# Patient Record
Sex: Male | Born: 1975
Health system: Southern US, Community
[De-identification: ages and names within clinical notes are randomized; demographics above are authoritative.]

## PROBLEM LIST (undated history)

## (undated) DIAGNOSIS — R569 Unspecified convulsions: Secondary | ICD-10-CM

## (undated) DIAGNOSIS — M792 Neuralgia and neuritis, unspecified: Secondary | ICD-10-CM

## (undated) DIAGNOSIS — G709 Myoneural disorder, unspecified: Secondary | ICD-10-CM

## (undated) DIAGNOSIS — R339 Retention of urine, unspecified: Secondary | ICD-10-CM

## (undated) DIAGNOSIS — S34101A Unspecified injury to L1 level of lumbar spinal cord, initial encounter: Secondary | ICD-10-CM

## (undated) HISTORY — DX: Retention of urine, unspecified: R33.9

## (undated) HISTORY — DX: Myoneural disorder, unspecified: G70.9

## (undated) HISTORY — PX: BACK SURGERY: SHX140

---

## 1999-03-14 ENCOUNTER — Emergency Department (HOSPITAL_COMMUNITY): Admission: EM | Admit: 1999-03-14 | Discharge: 1999-03-14 | Payer: Self-pay | Admitting: Emergency Medicine

## 1999-03-14 ENCOUNTER — Encounter: Payer: Self-pay | Admitting: Emergency Medicine

## 2001-02-06 ENCOUNTER — Emergency Department (HOSPITAL_COMMUNITY): Admission: EM | Admit: 2001-02-06 | Discharge: 2001-02-06 | Payer: Self-pay | Admitting: *Deleted

## 2002-08-29 ENCOUNTER — Emergency Department (HOSPITAL_COMMUNITY): Admission: EM | Admit: 2002-08-29 | Discharge: 2002-08-29 | Payer: Self-pay | Admitting: Emergency Medicine

## 2003-02-08 ENCOUNTER — Emergency Department (HOSPITAL_COMMUNITY): Admission: EM | Admit: 2003-02-08 | Discharge: 2003-02-08 | Payer: Self-pay | Admitting: Emergency Medicine

## 2003-07-23 ENCOUNTER — Emergency Department (HOSPITAL_COMMUNITY): Admission: EM | Admit: 2003-07-23 | Discharge: 2003-07-23 | Payer: Self-pay | Admitting: Emergency Medicine

## 2003-11-18 ENCOUNTER — Emergency Department (HOSPITAL_COMMUNITY): Admission: EM | Admit: 2003-11-18 | Discharge: 2003-11-18 | Payer: Self-pay | Admitting: Emergency Medicine

## 2003-12-01 ENCOUNTER — Emergency Department (HOSPITAL_COMMUNITY): Admission: EM | Admit: 2003-12-01 | Discharge: 2003-12-01 | Payer: Self-pay | Admitting: *Deleted

## 2004-01-20 ENCOUNTER — Emergency Department (HOSPITAL_COMMUNITY): Admission: EM | Admit: 2004-01-20 | Discharge: 2004-01-20 | Payer: Self-pay | Admitting: Emergency Medicine

## 2004-11-04 ENCOUNTER — Emergency Department (HOSPITAL_COMMUNITY): Admission: EM | Admit: 2004-11-04 | Discharge: 2004-11-04 | Payer: Self-pay | Admitting: Emergency Medicine

## 2005-08-13 ENCOUNTER — Emergency Department (HOSPITAL_COMMUNITY): Admission: EM | Admit: 2005-08-13 | Discharge: 2005-08-13 | Payer: Self-pay | Admitting: *Deleted

## 2006-03-14 ENCOUNTER — Inpatient Hospital Stay (HOSPITAL_COMMUNITY): Admission: EM | Admit: 2006-03-14 | Discharge: 2006-03-16 | Payer: Self-pay | Admitting: Emergency Medicine

## 2006-03-17 ENCOUNTER — Emergency Department (HOSPITAL_COMMUNITY): Admission: EM | Admit: 2006-03-17 | Discharge: 2006-03-17 | Payer: Self-pay | Admitting: Emergency Medicine

## 2006-03-20 ENCOUNTER — Ambulatory Visit: Payer: Self-pay | Admitting: Gastroenterology

## 2008-06-05 ENCOUNTER — Encounter: Admission: RE | Admit: 2008-06-05 | Discharge: 2008-06-05 | Payer: Self-pay | Admitting: Internal Medicine

## 2008-09-23 ENCOUNTER — Emergency Department (HOSPITAL_COMMUNITY): Admission: EM | Admit: 2008-09-23 | Discharge: 2008-09-23 | Payer: Self-pay | Admitting: Emergency Medicine

## 2010-08-09 ENCOUNTER — Emergency Department (HOSPITAL_COMMUNITY)
Admission: EM | Admit: 2010-08-09 | Discharge: 2010-08-09 | Disposition: A | Discharge status: Home / Self Care | Payer: Medicare Other | Attending: Emergency Medicine | Admitting: Emergency Medicine

## 2010-08-09 DIAGNOSIS — G40909 Epilepsy, unspecified, not intractable, without status epilepticus: Secondary | ICD-10-CM | POA: Insufficient documentation

## 2010-08-09 DIAGNOSIS — F29 Unspecified psychosis not due to a substance or known physiological condition: Secondary | ICD-10-CM | POA: Insufficient documentation
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
## 2012-05-20 ENCOUNTER — Ambulatory Visit
Admission: RE | Admit: 2012-05-20 | Discharge: 2012-05-20 | Disposition: A | Discharge status: Home / Self Care | Payer: Medicare Other | Source: Ambulatory Visit | Attending: Family Medicine | Admitting: Family Medicine

## 2012-05-20 ENCOUNTER — Other Ambulatory Visit: Payer: Self-pay | Admitting: Family Medicine

## 2012-05-20 DIAGNOSIS — T148XXA Other injury of unspecified body region, initial encounter: Secondary | ICD-10-CM

## 2012-06-06 ENCOUNTER — Encounter (HOSPITAL_COMMUNITY): Payer: Self-pay | Admitting: Emergency Medicine

## 2012-06-06 ENCOUNTER — Inpatient Hospital Stay (HOSPITAL_COMMUNITY)
Admission: EM | Admit: 2012-06-06 | Discharge: 2012-06-07 | DRG: 101 | Disposition: A | Discharge status: Home / Self Care | Payer: Medicare Other | Attending: Family Medicine | Admitting: Family Medicine

## 2012-06-06 DIAGNOSIS — G40909 Epilepsy, unspecified, not intractable, without status epilepticus: Secondary | ICD-10-CM

## 2012-06-06 DIAGNOSIS — R7309 Other abnormal glucose: Secondary | ICD-10-CM | POA: Diagnosis present

## 2012-06-06 DIAGNOSIS — Z79899 Other long term (current) drug therapy: Secondary | ICD-10-CM

## 2012-06-06 DIAGNOSIS — E872 Acidosis, unspecified: Secondary | ICD-10-CM | POA: Diagnosis present

## 2012-06-06 DIAGNOSIS — R569 Unspecified convulsions: Secondary | ICD-10-CM | POA: Diagnosis present

## 2012-06-06 DIAGNOSIS — D72829 Elevated white blood cell count, unspecified: Secondary | ICD-10-CM | POA: Diagnosis present

## 2012-06-06 DIAGNOSIS — F17200 Nicotine dependence, unspecified, uncomplicated: Secondary | ICD-10-CM | POA: Diagnosis present

## 2012-06-06 DIAGNOSIS — Z720 Tobacco use: Secondary | ICD-10-CM

## 2012-06-06 DIAGNOSIS — G40802 Other epilepsy, not intractable, without status epilepticus: Principal | ICD-10-CM | POA: Diagnosis present

## 2012-06-06 DIAGNOSIS — R739 Hyperglycemia, unspecified: Secondary | ICD-10-CM | POA: Diagnosis present

## 2012-06-06 DIAGNOSIS — N390 Urinary tract infection, site not specified: Secondary | ICD-10-CM | POA: Diagnosis present

## 2012-06-06 HISTORY — DX: Unspecified convulsions: R56.9

## 2012-06-06 HISTORY — DX: Unspecified injury to L1 level of lumbar spinal cord, initial encounter: S34.101A

## 2012-06-06 LAB — COMPREHENSIVE METABOLIC PANEL
ALT: 17 U/L (ref 0–53)
AST: 22 U/L (ref 0–37)
Albumin: 3.8 g/dL (ref 3.5–5.2)
Alkaline Phosphatase: 100 U/L (ref 39–117)
BUN: 13 mg/dL (ref 6–23)
CO2: 10 mEq/L — CL (ref 19–32)
GFR calc non Af Amer: >90 mL/min (ref >90)
Glucose, Bld: 289 mg/dL — ABNORMAL HIGH (ref 70–99)
Total Bilirubin: 0.2 mg/dL — ABNORMAL LOW (ref 0.3–1.2)

## 2012-06-06 LAB — CBC WITH DIFFERENTIAL/PLATELET
Basophils Absolute: 0 10*3/uL (ref 0.0–0.1)
Eosinophils Absolute: 0 10*3/uL (ref 0.0–0.7)
Eosinophils Relative: 0 % (ref 0–5)
HCT: 40.8 % (ref 39.0–52.0)
Lymphs Abs: 2 10*3/uL (ref 0.7–4.0)
Monocytes Absolute: 0.8 10*3/uL (ref 0.1–1.0)
WBC: 25.5 10*3/uL — ABNORMAL HIGH (ref 4.0–10.5)

## 2012-06-06 LAB — URINALYSIS, ROUTINE W REFLEX MICROSCOPIC
Ketones, ur: 40 mg/dL — AB
Nitrite: NEGATIVE
Protein, ur: 30 mg/dL — AB
Specific Gravity, Urine: 1.02 (ref 1.005–1.030)
pH: 5 (ref 5.0–8.0)

## 2012-06-06 LAB — POCT I-STAT, CHEM 8
BUN: 13 mg/dL (ref 6–23)
Creatinine, Ser: 1 mg/dL (ref 0.50–1.35)
Glucose, Bld: 284 mg/dL — ABNORMAL HIGH (ref 70–99)
HCT: 44 % (ref 39.0–52.0)
Hemoglobin: 15 g/dL (ref 13.0–17.0)
Potassium: 3.7 mEq/L (ref 3.5–5.1)
Sodium: 137 mEq/L (ref 135–145)
TCO2: 12 mmol/L (ref 0–100)

## 2012-06-06 LAB — URINE MICROSCOPIC-ADD ON

## 2012-06-06 LAB — GLUCOSE, CAPILLARY

## 2012-06-06 LAB — RAPID URINE DRUG SCREEN, HOSP PERFORMED: Amphetamines: NOT DETECTED

## 2012-06-06 MED ORDER — ONDANSETRON HCL 4 MG/2ML IJ SOLN
4.0000 mg | Freq: Once | INTRAMUSCULAR | Status: AC
  Administered 2012-06-06: 4 mg via INTRAVENOUS
  Filled 2012-06-06: qty 2

## 2012-06-06 NOTE — ED Notes (Signed)
Pt brought to ED via [facility] for evaluation of seizures X2, hx of seizures in the past.  Pt had a seizure, sister called EMS- EMS found pt to be postictal pale and diaphoretic, pt then had another seizure witnessed by EMS lasting around 40 seconds.  Pt postictal upon arrival to ED, IV in place.  Pt able to speak in full sentences, placed on cardiac monitor, IV in place.

## 2012-06-06 NOTE — ED Notes (Signed)
Pt's sister, Gilliam Hawkes, would like to be contacted if there is an emergency or when the pt is going to be discharged. [phone]

## 2012-06-06 NOTE — ED Notes (Signed)
Clear yellow urine returned with I&O cath, no complications.

## 2012-06-06 NOTE — ED Provider Notes (Signed)
History     CSN: [id number]  Arrival date & time 06/06/12  1904   First MD Initiated Contact with Patient 06/06/12 1929      Chief Complaint  Patient presents with  . Seizures   Level V caveat altered mental status (Consider location/radiation/quality/duration/timing/severity/associated sxs/prior treatment) HPI The patient had a seizure today. Denies noncompliance with his Neurontin. Presently complains of nausea no other complaint. No treatment prior to coming here Past Medical History  Diagnosis Date  . Seizures     History reviewed. No pertinent past surgical history.  No family history on file.  History  Substance Use Topics  . Smoking status: Not on file  . Smokeless tobacco: Not on file  . Alcohol Use: Not on file   admits to alcohol no drug use no tobacco    Review of Systems  Unable to perform ROS: Mental status change  Gastrointestinal: Positive for nausea.    Allergies  Review of patient's allergies indicates no known allergies.  Home Medications   Current Outpatient Rx  Name  Route  Sig  Dispense  Refill  . gabapentin (NEURONTIN) 800 MG tablet   Oral   Take 800 mg by mouth 3 (three) times daily.           BP 116/69  Temp(Src) 96.6 F (35.9 C) (Rectal)  Resp 14  SpO2 96%  Physical Exam  Nursing note and vitals reviewed. Constitutional: He appears well-developed and well-nourished.  HENT:  Head: Normocephalic and atraumatic.  Abrasion to her left lateral tongue  Eyes: Conjunctivae are normal. Pupils are equal, round, and reactive to light.  Neck: Neck supple. No tracheal deviation present. No thyromegaly present.  Cardiovascular: Normal rate and regular rhythm.   No murmur heard. Pulmonary/Chest: Effort normal and breath sounds normal.  Abdominal: Soft. Bowel sounds are normal. He exhibits no distension. There is no tenderness.  Musculoskeletal: Normal range of motion. He exhibits no edema and no tenderness.  Neurological: He is  alert. He has normal reflexes. No cranial nerve deficit. Coordination normal.  Oriented to name and hospital is on the date  Skin: Skin is warm and dry. No rash noted.  Psychiatric: He has a normal mood and affect.    ED Course  Procedures (including critical care time)  Labs Reviewed  CBC WITH DIFFERENTIAL  COMPREHENSIVE METABOLIC PANEL   Date: 06/06/2012  Rate: 105  Rhythm: sinus tachycardia  QRS Axis: right  Intervals: normal  ST/T Wave abnormalities: normal  Conduction Disutrbances:none  Narrative Interpretation:   Old EKG Reviewed: none available  No results found.  Results for orders placed during the hospital encounter of 06/06/12  CBC WITH DIFFERENTIAL      Result Value Range   WBC 25.5 (*) 4.0 - 10.5 K/uL   RBC 4.71  4.22 - 5.81 MIL/uL   Hemoglobin 14.3  13.0 - 17.0 g/dL   HCT 40.9  81.1 - 91.4 %   MCV 86.6  78.0 - 100.0 fL   MCH 30.4  26.0 - 34.0 pg   MCHC 35.0  30.0 - 36.0 g/dL   RDW 78.2  95.6 - 21.3 %   Platelets 329  150 - 400 K/uL   Neutrophils Relative 89 (*) 43 - 77 %   Lymphocytes Relative 8 (*) 12 - 46 %   Monocytes Relative 3  3 - 12 %   Eosinophils Relative 0  0 - 5 %   Basophils Relative 0  0 - 1 %   Neutro Abs  22.7 (*) 1.7 - 7.7 K/uL   Lymphs Abs 2.0  0.7 - 4.0 K/uL   Monocytes Absolute 0.8  0.1 - 1.0 K/uL   Eosinophils Absolute 0.0  0.0 - 0.7 K/uL   Basophils Absolute 0.0  0.0 - 0.1 K/uL   WBC Morphology ATYPICAL LYMPHOCYTES     Smear Review LARGE PLATELETS PRESENT    COMPREHENSIVE METABOLIC PANEL      Result Value Range   Sodium 134 (*) 135 - 145 mEq/L   Potassium 3.7  3.5 - 5.1 mEq/L   Chloride 99  96 - 112 mEq/L   CO2 10 (*) 19 - 32 mEq/L   Glucose, Bld 289 (*) 70 - 99 mg/dL   BUN 13  6 - 23 mg/dL   Creatinine, Ser 1.61  0.50 - 1.35 mg/dL   Calcium 8.7  8.4 - 09.6 mg/dL   Total Protein 7.5  6.0 - 8.3 g/dL   Albumin 3.8  3.5 - 5.2 g/dL   AST 22  0 - 37 U/L   ALT 17  0 - 53 U/L   Alkaline Phosphatase 100  39 - 117 U/L   Total  Bilirubin 0.2 (*) 0.3 - 1.2 mg/dL   GFR calc non Af Amer >90  >90 mL/min   GFR calc Af Amer >90  >90 mL/min  GLUCOSE, CAPILLARY      Result Value Range   Glucose-Capillary 249 (*) 70 - 99 mg/dL   Comment 1 Documented in Chart     Comment 2 Notify RN    POCT I-STAT, CHEM 8      Result Value Range   Sodium 137  135 - 145 mEq/L   Potassium 3.7  3.5 - 5.1 mEq/L   Chloride 109  96 - 112 mEq/L   BUN 13  6 - 23 mg/dL   Creatinine, Ser 0.45  0.50 - 1.35 mg/dL   Glucose, Bld 409 (*) 70 - 99 mg/dL   Calcium, Ion 8.11  9.14 - 1.23 mmol/L   TCO2 12  0 - 100 mmol/L   Hemoglobin 15.0  13.0 - 17.0 g/dL   HCT 78.2  95.6 - 21.3 %   Dg Foot Complete Left  05/20/2012  *RADIOLOGY REPORT*  Clinical Data: Larey Seat 2 months ago with pain and swelling of the left second toe  LEFT FOOT - COMPLETE 3+ VIEW  Comparison: None.  Findings: There is callus and new bone formation around a healing or healed distal left second metatarsal fracture near the neck.  No significant displacement is seen.  There is mild degenerative change at the left first MTP joint and left first DIP joint.  No erosion is seen.  Tarsal - metatarsal alignment is normal.  IMPRESSION:  1.  Healed or healing distal left second metatarsal fracture without significant displacement. 2.  Degenerative change involves the left great toe.   Original Report Authenticated By: Dwyane Dee, M.D.     No diagnosis found. At 11:30 PM patient remains disoriented to month and year. He does know the president. he knows his address he states he feels improved he is alert moves all extremities Patient aunt arrived who reports the patient had 2 generalized seizures today. She feels it has been compliant with his medications  MDM  Patient experienced prolonged post ictal phase after having had 2 seizures today. Leukocytosis and metabolic acidosis likely secondary to having had seizures Spoke with Dr. Toniann Fail Plan admit telemetry Dx #1seizure  disorder #2hyperglycemia  Doug Sou, MD 06/06/12 [phone]

## 2012-06-06 NOTE — ED Notes (Signed)
Critical CO2: 10 reported to Dr. Ethelda Chick.

## 2012-06-07 ENCOUNTER — Ambulatory Visit (HOSPITAL_COMMUNITY): Payer: Medicare Other

## 2012-06-07 ENCOUNTER — Encounter (HOSPITAL_COMMUNITY): Payer: Self-pay | Admitting: Internal Medicine

## 2012-06-07 ENCOUNTER — Observation Stay (HOSPITAL_COMMUNITY): Payer: Medicare Other

## 2012-06-07 DIAGNOSIS — G40909 Epilepsy, unspecified, not intractable, without status epilepticus: Secondary | ICD-10-CM

## 2012-06-07 DIAGNOSIS — R739 Hyperglycemia, unspecified: Secondary | ICD-10-CM | POA: Diagnosis present

## 2012-06-07 DIAGNOSIS — Z720 Tobacco use: Secondary | ICD-10-CM | POA: Diagnosis present

## 2012-06-07 DIAGNOSIS — E872 Acidosis, unspecified: Secondary | ICD-10-CM | POA: Diagnosis present

## 2012-06-07 DIAGNOSIS — D72829 Elevated white blood cell count, unspecified: Secondary | ICD-10-CM | POA: Diagnosis present

## 2012-06-07 DIAGNOSIS — R569 Unspecified convulsions: Secondary | ICD-10-CM | POA: Diagnosis present

## 2012-06-07 LAB — COMPREHENSIVE METABOLIC PANEL
AST: 27 U/L (ref 0–37)
BUN: 17 mg/dL (ref 6–23)
CO2: 20 mEq/L (ref 19–32)
Chloride: 104 mEq/L (ref 96–112)
Creatinine, Ser: 1.91 mg/dL — ABNORMAL HIGH (ref 0.50–1.35)
GFR calc non Af Amer: 43 mL/min — ABNORMAL LOW (ref >90)
Glucose, Bld: 104 mg/dL — ABNORMAL HIGH (ref 70–99)
Total Bilirubin: 0.2 mg/dL — ABNORMAL LOW (ref 0.3–1.2)

## 2012-06-07 LAB — BASIC METABOLIC PANEL
BUN: 16 mg/dL (ref 6–23)
Calcium: 8.9 mg/dL (ref 8.4–10.5)
Creatinine, Ser: 1.3 mg/dL (ref 0.50–1.35)
GFR calc Af Amer: 79 mL/min — ABNORMAL LOW (ref >90)
GFR calc non Af Amer: 68 mL/min — ABNORMAL LOW (ref >90)
Glucose, Bld: 98 mg/dL (ref 70–99)
Sodium: 136 mEq/L (ref 135–145)

## 2012-06-07 LAB — CBC WITH DIFFERENTIAL/PLATELET
Eosinophils Absolute: 0 10*3/uL (ref 0.0–0.7)
Lymphs Abs: 2.2 10*3/uL (ref 0.7–4.0)
MCH: 29.9 pg (ref 26.0–34.0)
Neutrophils Relative %: 82 % — ABNORMAL HIGH (ref 43–77)
Platelets: 276 10*3/uL (ref 150–400)
RBC: 4.15 MIL/uL — ABNORMAL LOW (ref 4.22–5.81)
WBC: 19.9 10*3/uL — ABNORMAL HIGH (ref 4.0–10.5)

## 2012-06-07 LAB — LACTIC ACID, PLASMA: Lactic Acid, Venous: 1.6 mmol/L (ref 0.5–2.2)

## 2012-06-07 LAB — GLUCOSE, CAPILLARY: Glucose-Capillary: 108 mg/dL — ABNORMAL HIGH (ref 70–99)

## 2012-06-07 MED ORDER — ACETAMINOPHEN 325 MG PO TABS
650.0000 mg | ORAL_TABLET | Freq: Four times a day (QID) | ORAL | Status: DC | PRN
  Administered 2012-06-07 (×2): 650 mg via ORAL
  Filled 2012-06-07 (×2): qty 2

## 2012-06-07 MED ORDER — ONDANSETRON HCL 4 MG PO TABS: 4.0000 mg | ORAL_TABLET | Freq: Four times a day (QID) | ORAL | Status: DC | PRN

## 2012-06-07 MED ORDER — SULFAMETHOXAZOLE-TRIMETHOPRIM 800-160 MG PO TABS: 1.0000 | ORAL_TABLET | Freq: Two times a day (BID) | ORAL | Status: DC

## 2012-06-07 MED ORDER — LEVETIRACETAM 500 MG PO TABS
500.0000 mg | ORAL_TABLET | Freq: Two times a day (BID) | ORAL | Status: DC
  Administered 2012-06-07: 500 mg via ORAL
  Filled 2012-06-07 (×2): qty 1

## 2012-06-07 MED ORDER — NICOTINE 14 MG/24HR TD PT24
14.0000 mg | MEDICATED_PATCH | Freq: Every day | TRANSDERMAL | Status: DC
  Administered 2012-06-07: 14 mg via TRANSDERMAL
  Filled 2012-06-07: qty 1

## 2012-06-07 MED ORDER — SODIUM CHLORIDE 0.9 % IV SOLN
1000.0000 mg | Freq: Once | INTRAVENOUS | Status: DC
  Filled 2012-06-07: qty 10

## 2012-06-07 MED ORDER — LEVETIRACETAM 500 MG PO TABS: 500.0000 mg | ORAL_TABLET | Freq: Two times a day (BID) | ORAL | Status: DC

## 2012-06-07 MED ORDER — LORAZEPAM 2 MG/ML IJ SOLN: 1.0000 mg | INTRAMUSCULAR | Status: DC | PRN

## 2012-06-07 MED ORDER — SODIUM CHLORIDE 0.9 % IV SOLN
INTRAVENOUS | Status: DC
  Administered 2012-06-07: 02:00:00 via INTRAVENOUS

## 2012-06-07 MED ORDER — ONDANSETRON HCL 4 MG/2ML IJ SOLN: 4.0000 mg | Freq: Four times a day (QID) | INTRAMUSCULAR | Status: DC | PRN

## 2012-06-07 MED ORDER — ACETAMINOPHEN 650 MG RE SUPP: 650.0000 mg | Freq: Four times a day (QID) | RECTAL | Status: DC | PRN

## 2012-06-07 MED ORDER — DEXTROSE 5 % IV SOLN
1.0000 g | INTRAVENOUS | Status: DC
  Filled 2012-06-07: qty 10

## 2012-06-07 MED ORDER — GABAPENTIN 400 MG PO CAPS
800.0000 mg | ORAL_CAPSULE | Freq: Three times a day (TID) | ORAL | Status: DC
Start: 2012-06-07 — End: 2012-06-07
  Administered 2012-06-07: 800 mg via ORAL
  Filled 2012-06-07 (×3): qty 2

## 2012-06-07 MED ORDER — SODIUM CHLORIDE 0.9 % IJ SOLN
3.0000 mL | Freq: Two times a day (BID) | INTRAMUSCULAR | Status: DC
  Administered 2012-06-07: 3 mL via INTRAVENOUS

## 2012-06-07 MED ORDER — INSULIN ASPART 100 UNIT/ML ~~LOC~~ SOLN: 0.0000 [IU] | Freq: Three times a day (TID) | SUBCUTANEOUS | Status: DC
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
## 2012-06-07 NOTE — Discharge Summary (Signed)
Physician Discharge Summary  Thomas Morales:[id number] DOB: 1976/03/31 DOA: 06/06/2012  PCP: Default, Provider, MD  Admit date: 06/06/2012 Discharge date: 06/07/2012  Time spent: 20 minutes  Recommendations for Outpatient Follow-up:  1. Please follow urine cultures ordered which are pending from admission 2. Continue Keppra 500 po bid   Discharge Diagnoses:  Principal Problem:   Seizures Active Problems:   Tobacco abuse   Hyperglycemia   Metabolic acidosis   Leucocytosis   Discharge Condition: Good   Diet recommendation: regular  Filed Weights   06/07/12 0145  Weight: 60 kg (132 lb 4.4 oz)    History of present illness:  37 year old male admitted to 06/07/12 morning. Patient had 2 episodes of seizures and was slightly confused. As noted he had a metabolic acidosis and hyperglycemia with elevated white count. Patient was loaded with IV Keppra per neurology instructions and then recommended to continue Keppra 500 twice a day in addition to his home dosage of Neurontin.  Patient also the urine culture done given leukocytosis-thought to be secondary to seizure activity-urine however did show some bacteria therefore patient was discharged home on Bactrim twice a day-she has primary care physician who can followup with him regarding this. Patient exhibits little burning in the urine dysuria fever or chills  Procedures:  CT head 2/21 = no acute intracranial abnormality  EEG ordered but pending at time of discharge  Consultations:  Neurology  Discharge Exam: Filed Vitals:   06/07/12 0030 06/07/12 0145 06/07/12 0200 06/07/12 0600  BP: 113/71  121/87 105/51  Pulse: 64  70 73  Temp:   98.3 F (36.8 C) 98 F (36.7 C)  TempSrc:      Resp:   16 16  Height:  5\' 8"  (1.727 m)    Weight:  60 kg (132 lb 4.4 oz)    SpO2: 95%  100% 98%   Alert well no acute distress no further seizure activity Chest clinically clear no added sound S1-S2 no murmur rub or Neurologically good  power bilaterally upper and lower extremities reflexes baseline sensory loss in L4-5 S1 distribution with some sparing on the left  Discharge Instructions     Medication List    ASK your doctor about these medications       gabapentin 800 MG tablet  Commonly known as:  NEURONTIN  Take 800 mg by mouth 3 (three) times daily.          The results of significant diagnostics from this hospitalization (including imaging, microbiology, ancillary and laboratory) are listed below for reference.    Significant Diagnostic Studies: Ct Head Wo Contrast  06/07/2012  *RADIOLOGY REPORT*  Clinical Data: Seizure  CT HEAD WITHOUT CONTRAST  Technique:  Contiguous axial images were obtained from the base of the skull through the vertex without contrast.  Comparison: 11/04/2004  Findings: There is no evidence for acute hemorrhage, hydrocephalus, mass lesion, or abnormal extra-axial fluid collection.  No definite CT evidence for acute infarction.  Right maxillary sinus mucosal thickening.  Otherwise, the visualized paranasal sinuses and mastoid air cells are predominately clear.  IMPRESSION: No acute intracranial abnormality.   Original Report Authenticated By: Jearld Lesch, M.D.    Dg Foot Complete Left  05/20/2012  *RADIOLOGY REPORT*  Clinical Data: Larey Seat 2 months ago with pain and swelling of the left second toe  LEFT FOOT - COMPLETE 3+ VIEW  Comparison: None.  Findings: There is callus and new bone formation around a healing or healed distal left second metatarsal fracture  near the neck.  No significant displacement is seen.  There is mild degenerative change at the left first MTP joint and left first DIP joint.  No erosion is seen.  Tarsal - metatarsal alignment is normal.  IMPRESSION:  1.  Healed or healing distal left second metatarsal fracture without significant displacement. 2.  Degenerative change involves the left great toe.   Original Report Authenticated By: Dwyane Dee, M.D.      Microbiology: No results found for this or any previous visit (from the past 240 hour(s)).   Labs: Basic Metabolic Panel:  Recent Labs Lab 06/06/12 1941 06/06/12 2010 06/06/12 2336 06/07/12 0604  NA 134* 137 136 135  K 3.7 3.7 4.1 4.1  CL 99 109 103 104  CO2 10*  --  22 20  GLUCOSE 289* 284* 98 104*  BUN 13 13 16 17   CREATININE 0.90 1.00 1.30 1.91*  CALCIUM 8.7  --  8.9 8.4   Liver Function Tests:  Recent Labs Lab 06/06/12 1941 06/07/12 0604  AST 22 27  ALT 17 14  ALKPHOS 100 82  BILITOT 0.2* 0.2*  PROT 7.5 6.5  ALBUMIN 3.8 3.2*   No results found for this basename: LIPASE, AMYLASE,  in the last 168 hours No results found for this basename: AMMONIA,  in the last 168 hours CBC:  Recent Labs Lab 06/06/12 1941 06/06/12 2010 06/07/12 0604  WBC 25.5*  --  19.9*  NEUTROABS 22.7*  --  16.3*  HGB 14.3 15.0 12.4*  HCT 40.8 44.0 35.1*  MCV 86.6  --  84.6  PLT 329  --  276   Cardiac Enzymes: No results found for this basename: CKTOTAL, CKMB, CKMBINDEX, TROPONINI,  in the last 168 hours BNP: BNP (last 3 results) No results found for this basename: PROBNP,  in the last 8760 hours CBG:  Recent Labs Lab 06/06/12 1958 06/07/12 0211 06/07/12 0643 06/07/12 1113  GLUCAP 249* 108* 120* 124*       Signed:  Rhetta Mura  [facility] 06/07/2012, 1:26 PM

## 2012-06-07 NOTE — ED Notes (Signed)
Pt given PO water, tolerating well

## 2012-06-08 LAB — URINE CULTURE

## 2012-07-25 ENCOUNTER — Other Ambulatory Visit: Payer: Self-pay | Admitting: Family Medicine

## 2012-07-25 NOTE — Telephone Encounter (Signed)
rx rf per protocol °

## 2012-09-16 ENCOUNTER — Other Ambulatory Visit: Payer: Self-pay | Admitting: Family Medicine

## 2012-10-14 ENCOUNTER — Telehealth: Payer: Self-pay | Admitting: Family Medicine

## 2012-10-17 NOTE — Telephone Encounter (Signed)
Need ov and appt made.

## 2012-10-25 ENCOUNTER — Ambulatory Visit (INDEPENDENT_AMBULATORY_CARE_PROVIDER_SITE_OTHER): Payer: Medicare Other | Admitting: Family Medicine

## 2012-10-25 ENCOUNTER — Encounter: Payer: Self-pay | Admitting: Family Medicine

## 2012-10-25 VITALS — BP 80/54 | HR 110 | Temp 98.3°F | Resp 16 | Ht 67.5 in | Wt 125.0 lb

## 2012-10-25 DIAGNOSIS — R339 Retention of urine, unspecified: Secondary | ICD-10-CM | POA: Insufficient documentation

## 2012-10-25 DIAGNOSIS — I959 Hypotension, unspecified: Secondary | ICD-10-CM

## 2012-10-25 DIAGNOSIS — G709 Myoneural disorder, unspecified: Secondary | ICD-10-CM | POA: Insufficient documentation

## 2012-10-25 DIAGNOSIS — N200 Calculus of kidney: Secondary | ICD-10-CM

## 2012-10-25 DIAGNOSIS — M25569 Pain in unspecified knee: Secondary | ICD-10-CM

## 2012-10-25 DIAGNOSIS — M25562 Pain in left knee: Secondary | ICD-10-CM

## 2012-10-25 LAB — COMPLETE METABOLIC PANEL WITH GFR
ALT: 14 U/L (ref 0–53)
Albumin: 3.7 g/dL (ref 3.5–5.2)
CO2: 24 mEq/L (ref 19–32)
Calcium: 8.5 mg/dL (ref 8.4–10.5)
Chloride: 109 mEq/L (ref 96–112)
GFR, Est African American: >89 mL/min
GFR, Est Non African American: >89 mL/min
Glucose, Bld: 83 mg/dL (ref 70–99)
Potassium: 4 mEq/L (ref 3.5–5.3)
Sodium: 141 mEq/L (ref 135–145)
Total Protein: 5.8 g/dL — ABNORMAL LOW (ref 6.0–8.3)

## 2012-10-25 LAB — CBC W/MCH & 3 PART DIFF
Lymphocytes Relative: 37 % (ref 12–46)
MCH: 30 pg (ref 26.0–34.0)
MCHC: 33.3 g/dL (ref 30.0–36.0)
MCV: 89.9 fL (ref 78.0–100.0)
Platelets: 254 10*3/uL (ref 150–400)
RDW: 13.7 % (ref 11.5–15.5)
WBC mixed population: 0.8 10*3/uL (ref 0.1–1.8)
WBC: 7.5 10*3/uL (ref 4.0–10.5)

## 2012-10-25 MED ORDER — SODIUM CHLORIDE 0.9 % IV SOLN
Freq: Once | INTRAVENOUS | Status: AC
  Administered 2012-10-25: 1500 mL via INTRAVENOUS
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
## 2012-10-29 ENCOUNTER — Telehealth: Payer: Self-pay | Admitting: Family Medicine

## 2012-10-31 MED ORDER — HYDROCODONE-ACETAMINOPHEN 5-325 MG PO TABS: 1.0000 | ORAL_TABLET | Freq: Three times a day (TID) | ORAL | Status: DC | PRN

## 2012-10-31 NOTE — Telephone Encounter (Signed)
Per Dr. Tanya Nones we are still waiting on drug screen but in the mean time he can have #30 Norco 5/325 mg..Patient aware and med c/o

## 2012-11-04 ENCOUNTER — Other Ambulatory Visit: Payer: Self-pay | Admitting: Family Medicine

## 2012-11-04 DIAGNOSIS — M5416 Radiculopathy, lumbar region: Secondary | ICD-10-CM

## 2012-11-09 ENCOUNTER — Other Ambulatory Visit: Payer: Self-pay | Admitting: Family Medicine

## 2012-12-29 ENCOUNTER — Other Ambulatory Visit: Payer: Self-pay | Admitting: Family Medicine

## 2013-02-03 ENCOUNTER — Telehealth: Payer: Self-pay | Admitting: Family Medicine

## 2013-02-03 NOTE — Telephone Encounter (Signed)
Finally got a appointment to the pain clinic . Wants to know if you can give him something until his appointment? Please call

## 2013-02-03 NOTE — Telephone Encounter (Signed)
Pt aware of message

## 2013-02-03 NOTE — Telephone Encounter (Signed)
Needs to be seen at pain clinic.

## 2013-02-03 NOTE — Telephone Encounter (Signed)
Got appt to pain clinic, wants something until he is seen.

## 2013-06-01 ENCOUNTER — Other Ambulatory Visit: Payer: Self-pay | Admitting: Family Medicine

## 2013-08-04 ENCOUNTER — Other Ambulatory Visit (HOSPITAL_COMMUNITY): Payer: Self-pay | Admitting: Family Medicine

## 2013-08-09 IMAGING — CT CT HEAD W/O CM
1 series · 16 of 30 positions shown, 20 images · non-contrast
Comparison: 11/04/2004

CLINICAL DATA: Seizure

CT HEAD WITHOUT CONTRAST
TECHNIQUE: Contiguous axial images were obtained from the base of
the skull through the vertex without contrast.

[Series 2: head trauma 4.8 h37s · axial · 0.45mm/px · z∈[-101,+56]mm · 16 of 36 slices shown, 20 images]
[im 2/36  brain]
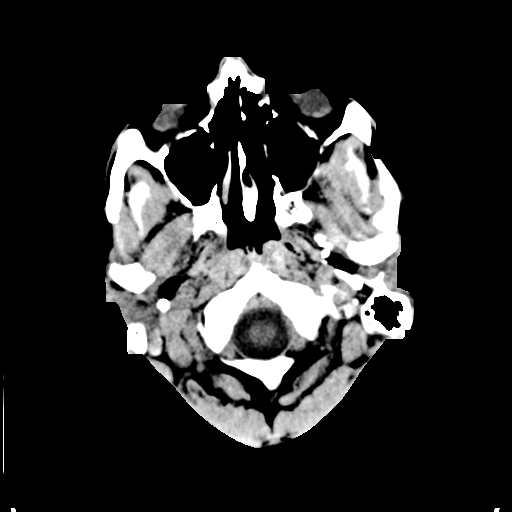
[im 2/36  bone]
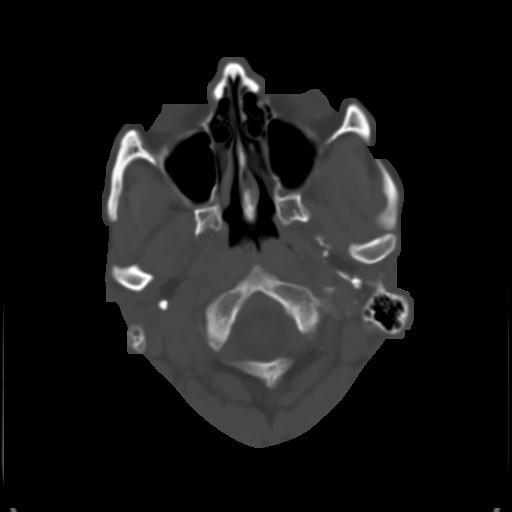
[im 4/36  brain]
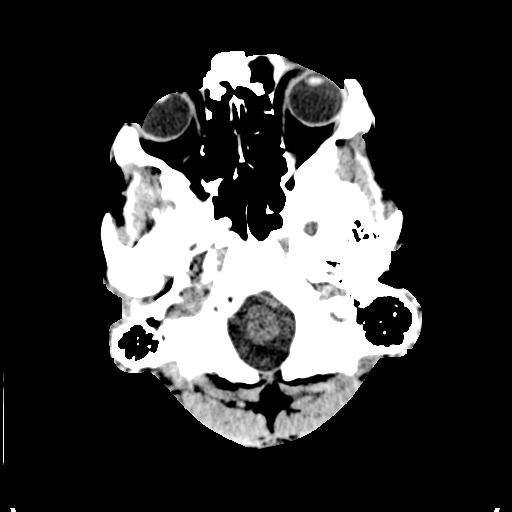
[im 7/36  brain]
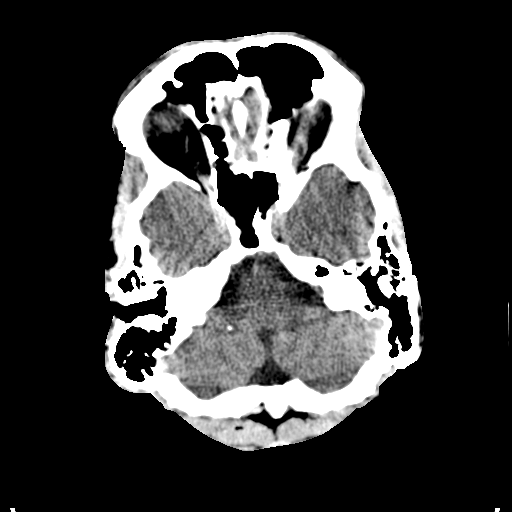
[im 9/36  brain]
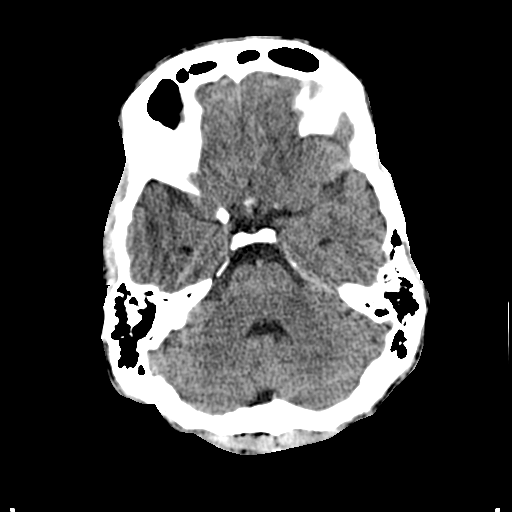
[im 10/36  brain]
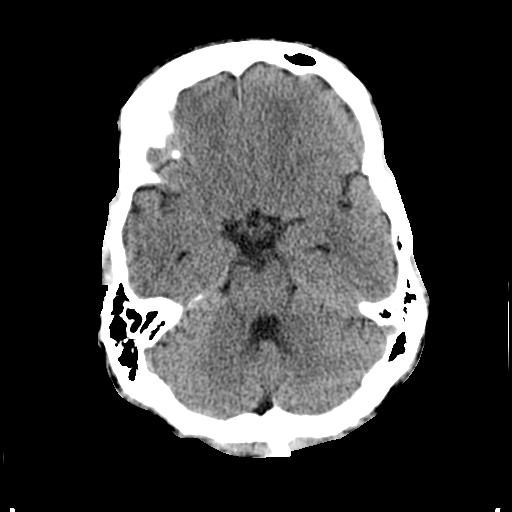
[im 10/36  bone]
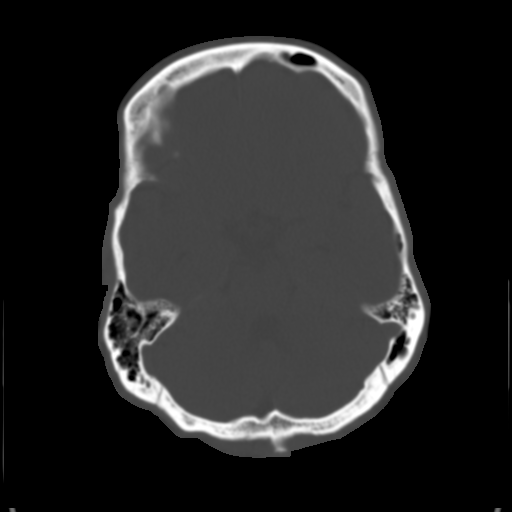
[im 13/36  brain]
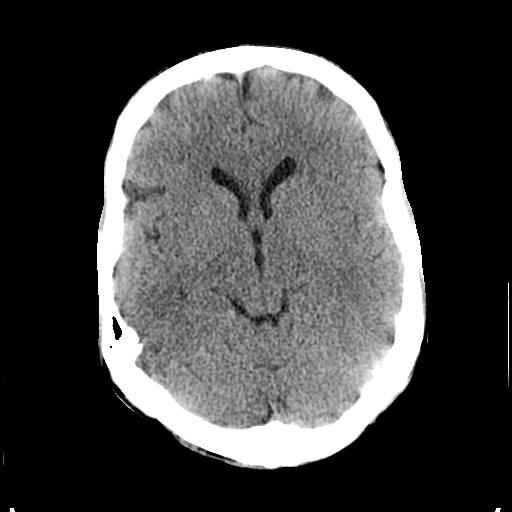
[im 15/36  brain]
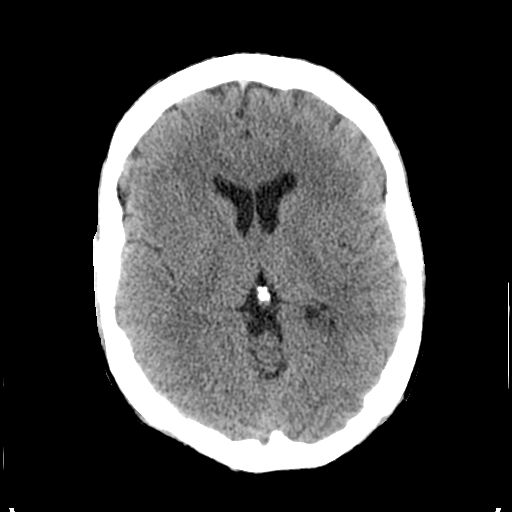
[im 17/36  brain]
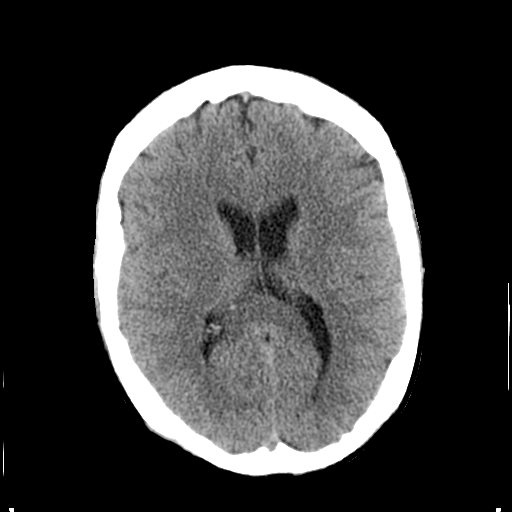
[im 19/36  brain]
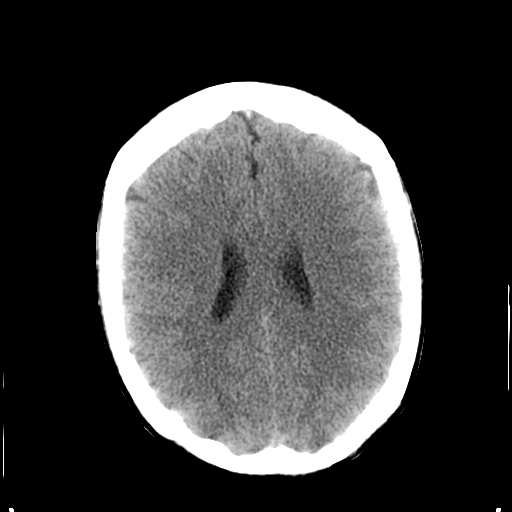
[im 19/36  bone]
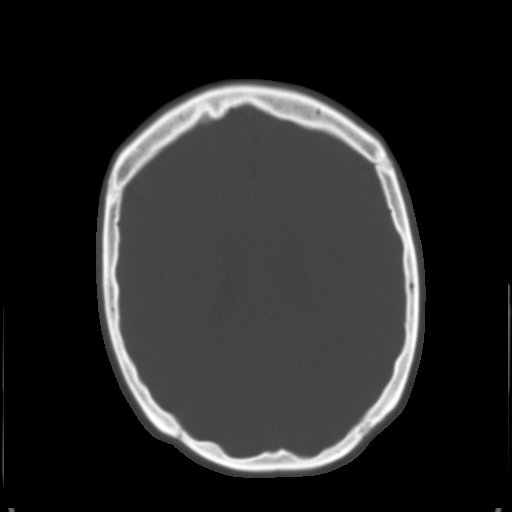
[im 21/36  brain]
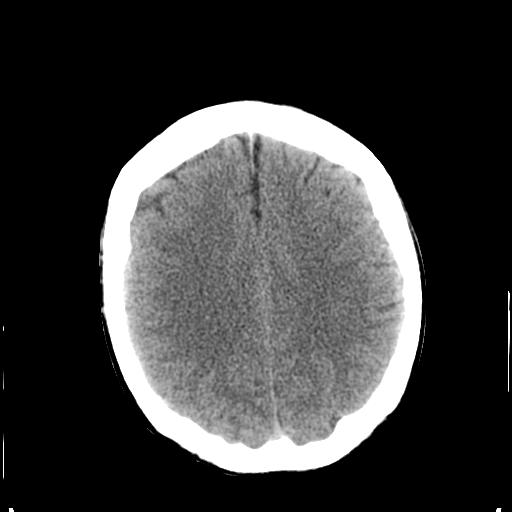
[im 23/36  brain]
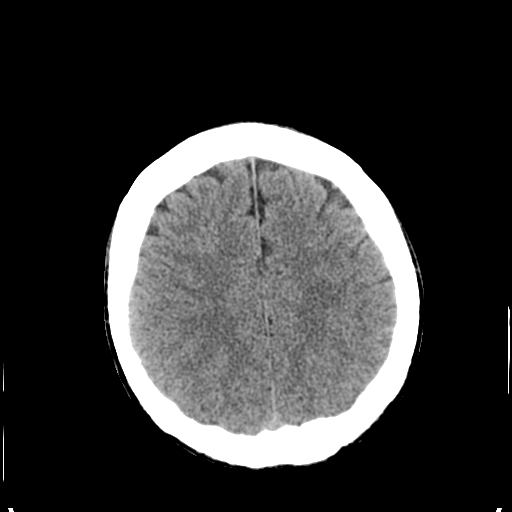
[im 26/36  brain]
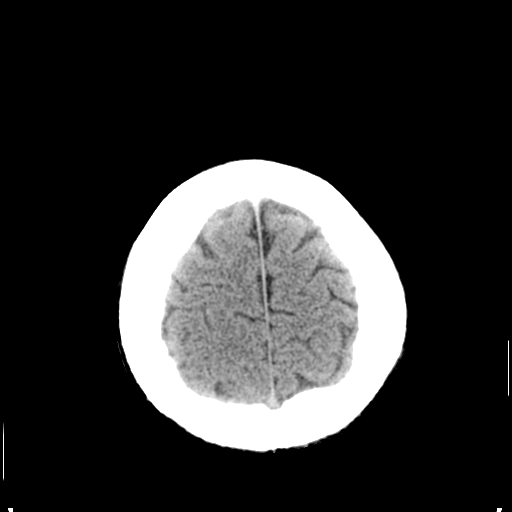
[im 27/36  brain]
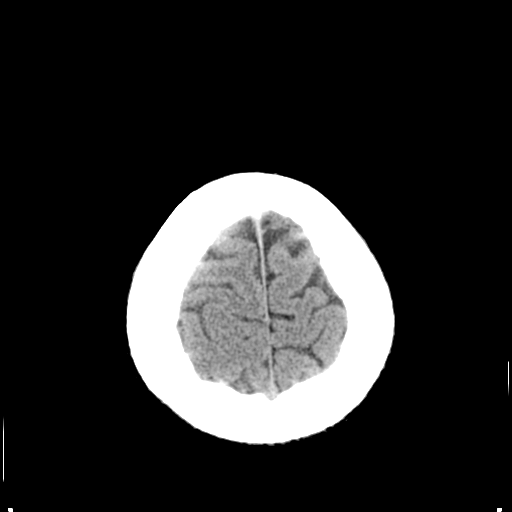
[im 27/36  bone]
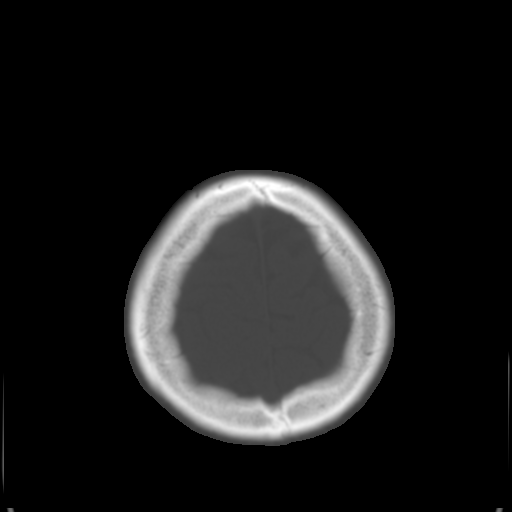
[im 29/36  brain]
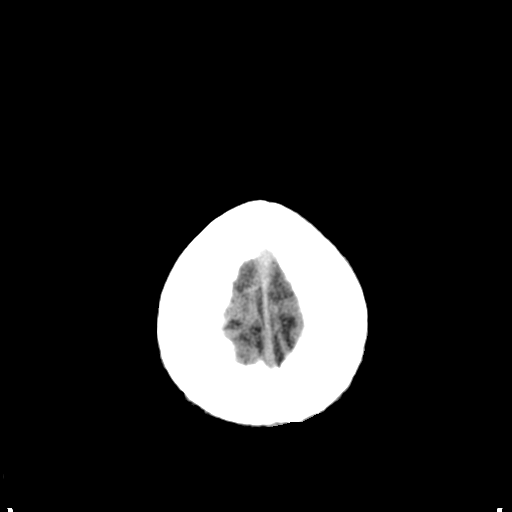
[im 32/36  brain]
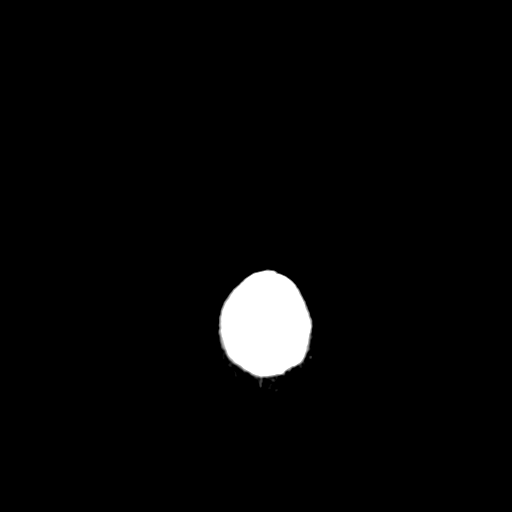
[im 34/36  brain]
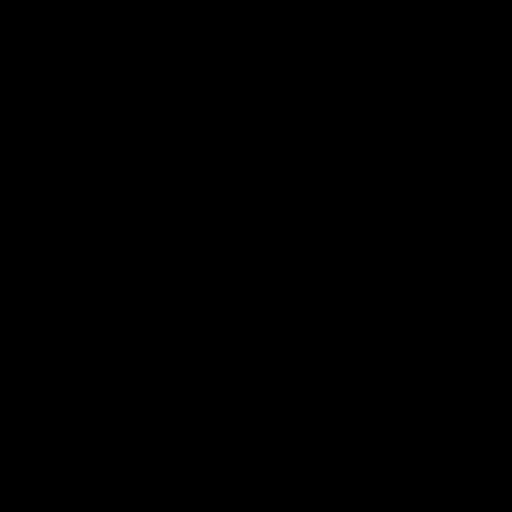

[16 of 30 positions shown; findings below may reference images not displayed]

FINDINGS: There is no evidence for acute hemorrhage, hydrocephalus,
mass lesion, or abnormal extra-axial fluid collection.  No definite
CT evidence for acute infarction.  Right maxillary sinus mucosal
thickening.  Otherwise, the visualized paranasal sinuses and
mastoid air cells are predominately clear.
IMPRESSION: No acute intracranial abnormality.

## 2013-09-29 ENCOUNTER — Other Ambulatory Visit (HOSPITAL_COMMUNITY): Payer: Self-pay | Admitting: Family Medicine

## 2013-11-06 ENCOUNTER — Other Ambulatory Visit: Payer: Self-pay | Admitting: *Deleted

## 2013-11-06 ENCOUNTER — Other Ambulatory Visit: Payer: Self-pay | Admitting: Family Medicine

## 2013-11-06 MED ORDER — GABAPENTIN 800 MG PO TABS: ORAL_TABLET | ORAL | Status: DC

## 2013-11-06 NOTE — Telephone Encounter (Signed)
Patient has been dismissed by provider. No further refills will be given.

## 2013-12-08 ENCOUNTER — Other Ambulatory Visit: Payer: Self-pay | Admitting: Family Medicine

## 2014-01-26 ENCOUNTER — Encounter (HOSPITAL_COMMUNITY): Payer: Self-pay | Admitting: Emergency Medicine

## 2014-01-26 DIAGNOSIS — Z79899 Other long term (current) drug therapy: Secondary | ICD-10-CM | POA: Insufficient documentation

## 2014-01-26 DIAGNOSIS — N390 Urinary tract infection, site not specified: Secondary | ICD-10-CM | POA: Diagnosis not present

## 2014-01-26 DIAGNOSIS — Z8669 Personal history of other diseases of the nervous system and sense organs: Secondary | ICD-10-CM | POA: Insufficient documentation

## 2014-01-26 DIAGNOSIS — N3090 Cystitis, unspecified without hematuria: Secondary | ICD-10-CM | POA: Diagnosis present

## 2014-01-26 DIAGNOSIS — Z720 Tobacco use: Secondary | ICD-10-CM | POA: Diagnosis not present

## 2014-01-26 NOTE — ED Notes (Signed)
Pt. reports bladder pain with cloudy/malodorous urine onset today , pt. catheterize himself .

## 2014-01-27 ENCOUNTER — Emergency Department (HOSPITAL_COMMUNITY)
Admission: EM | Admit: 2014-01-27 | Discharge: 2014-01-27 | Disposition: A | Discharge status: Home / Self Care | Payer: Medicare Other | Attending: Emergency Medicine | Admitting: Emergency Medicine

## 2014-01-27 DIAGNOSIS — N390 Urinary tract infection, site not specified: Secondary | ICD-10-CM | POA: Diagnosis not present

## 2014-01-27 DIAGNOSIS — Z789 Other specified health status: Secondary | ICD-10-CM

## 2014-01-27 LAB — URINALYSIS, ROUTINE W REFLEX MICROSCOPIC
Bilirubin Urine: NEGATIVE
GLUCOSE, UA: NEGATIVE mg/dL
Ketones, ur: NEGATIVE mg/dL
Nitrite: POSITIVE — AB
PH: 6.5 (ref 5.0–8.0)
Protein, ur: 100 mg/dL — AB
Specific Gravity, Urine: 1.029 (ref 1.005–1.030)
Urobilinogen, UA: 1 mg/dL (ref 0.0–1.0)

## 2014-01-27 LAB — URINE MICROSCOPIC-ADD ON

## 2014-01-27 MED ORDER — PHENAZOPYRIDINE HCL 200 MG PO TABS: 200.0000 mg | ORAL_TABLET | Freq: Three times a day (TID) | ORAL | Status: DC | PRN

## 2014-01-27 MED ORDER — CIPROFLOXACIN HCL 500 MG PO TABS
500.0000 mg | ORAL_TABLET | Freq: Once | ORAL | Status: AC
  Administered 2014-01-27: 500 mg via ORAL
  Filled 2014-01-27: qty 1

## 2014-01-27 MED ORDER — CIPROFLOXACIN HCL 500 MG PO TABS: 500.0000 mg | ORAL_TABLET | Freq: Two times a day (BID) | ORAL | Status: DC

## 2014-01-27 MED ORDER — PHENAZOPYRIDINE HCL 100 MG PO TABS
200.0000 mg | ORAL_TABLET | Freq: Once | ORAL | Status: AC
  Administered 2014-01-27: 200 mg via ORAL
  Filled 2014-01-27: qty 2

## 2014-01-27 NOTE — ED Notes (Signed)
Patient presents stating that he self caths since '95 and has been having burning and sharp after done cathing.

## 2014-01-27 NOTE — Discharge Instructions (Signed)
Catheter-Associated Urinary Tract Infection FAQs °WHAT IS "CATHETER-ASSOCIATED" URINARY TRACT INFECTION? °A urinary tract infection (also called "UTI") is an infection in the urinary system, which includes the bladder (which stores the urine) and the kidneys (which filter the blood to make urine). Germs (for example, bacteria or yeasts) do not normally live in these areas; but if germs are introduced, an infection can occur. If you have a urinary catheter, germs can travel along the catheter and cause an infection in your bladder or your kidney; in that case it is called a catheter-associated urinary tract infection (or "CA-UTI").  °WHAT IS A URINARY CATHETER? °A urinary catheter is a thin tube placed in the bladder to drain urine. Urine drains through the tube into a bag that collects the urine. A urinary  °catheter may be used: °· If you are not able to urinate on your own. °· To measure the amount of urine that you make, for example, during intensive care. °· During and after some types of surgery. °· During some tests of the kidneys and bladder . °People with urinary catheters have a much higher chance of getting a urinary tract infection than people who don't have a catheter. °HOW DO I GET A CATHETER-ASSOCIATED URINARY TRACT INFECTION (CA-UTI)? °If germs enter the urinary tract, they may cause an infection. Many of the germs that cause a catheter-associated urinary tract infection are common germs found in your intestines that do not usually cause an infection there. Germs can enter the urinary tract when the catheter is being put in or while the catheter remains in the bladder.  °WHAT ARE THE SYMPTOMS OF A URINARY TRACT INFECTION?  °Some of the common symptoms of a urinary tract infection are: °· Burning or pain in the lower abdomen (that is, below the stomach). °· Fever. °· Bloody urine may be a sign of infection, but is also caused by other problems . °· Burning during urination or an increase in the  frequency of urination after the catheter is removed. °Sometimes people with catheter-associated urinary tract infections do not have these symptoms of infection. °CAN CATHETER-ASSOCIATED URINARY TRACT INFECTIONS BE TREATED? °Yes, most catheter-associated urinary tract infections can be treated with antibiotics and removal or change of the catheter. Your doctor will determine which antibiotic is best for you.  °WHAT ARE SOME OF THE THINGS THAT HOSPITALS ARE DOING TO PREVENT CATHETER-ASSOCIATED URINARY TRACT INFECTIONS? °To prevent urinary tract infections, doctors and nurses take the following actions.  °Catheter insertion °· Catheters are put in only when necessary and they are removed as soon as possible. °· Only properly trained persons insert catheters using sterile ("clean") technique. °· The skin in the area where the catheter will be inserted is cleaned before inserting the catheter. °· Other methods to drain the urine are sometimes used, such as: °¨ External catheters in men (these look like condoms and are placed over the penis rather than into the penis) °¨ Putting a temporary catheter in to drain the urine and removing it right away. This is called intermittent urethral catheterization. °Catheter care °· Healthcare providers clean their hands by washing them with soap and water or using an alcohol-based hand rub before and after touching your catheter. °¨ If you do not see your providers clean their hands, please ask them to do so. °· Avoid disconnecting the catheter and drain tube. This helps to prevent germs from getting into the catheter tube. °· The catheter is secured to the leg to prevent pulling on the   catheter.  Avoid twisting or kinking the catheter.  Keep the bag lower than the bladder to prevent urine from backflowing to the bladder.  Empty the bag regularly. The drainage spout should not touch anything while emptying the bag. WHAT CAN I DO TO HELP PREVENT CATHETER-ASSOCIATED URINARY  TRACT INFECTIONS IF I HAVE A CATHETER?  Always clean your hands before and after doing catheter care.  Always keep your urine bag below the level of your bladder.  Do not tug or pull on the tubing.  Do not twist or kink the catheter tubing.  Ask your healthcare provider each day if you still need the catheter. WHAT DO I NEED TO DO WHEN I GO HOME FROM THE HOSPITAL?  If you will be going home with a catheter, your doctor or nurse should explain everything you need to know about taking care of the catheter. Make sure you understand how to care for it before you leave the hospital.  If you develop any of the symptoms of a urinary tract infection, such as burning or pain in the lower abdomen, fever, or an increase in the frequency of urination, contact your doctor or nurse immediately.  Before you go home, make sure you know who to contact if you have questions or problems after you get home. If you have questions, please ask your doctor or nurse. Developed and co-sponsored by [employer] for [employer] of [place] [phone]); Infectious Diseases Society of America (IDSA); The [facility]; Association for Professionals in Infection Control and Epidemiology (APIC); Center for Disease Control (CDC); and The Joint Commission Document Released: 12/27/2011 Document Reviewed: 12/27/2011 [facility] Patient Information 2015 [place], [place]. This information is not intended to replace advice given to you by your health care provider. Make sure you discuss any questions you have with your health care provider.   Urinary Tract Infection A urinary tract infection (UTI) can occur any place along the urinary tract. The tract includes the kidneys, ureters, bladder, and urethra. A type of germ called bacteria often causes a UTI. UTIs are often helped with antibiotic medicine.  HOME CARE   If given, take antibiotics as told by your doctor. Finish them even if you start to feel  better.  Drink enough fluids to keep your pee (urine) clear or pale yellow.  Avoid tea, drinks with caffeine, and bubbly (carbonated) drinks.  Pee often. Avoid holding your pee in for a long time.  Pee before and after having sex (intercourse).  Wipe from front to back after you poop (bowel movement) if you are a woman. Use each tissue only once. GET HELP RIGHT AWAY IF:   You have back pain.  You have lower belly (abdominal) pain.  You have chills.  You feel sick to your stomach (nauseous).  You throw up (vomit).  Your burning or discomfort with peeing does not go away.  You have a fever.  Your symptoms are not better in 3 days. MAKE SURE YOU:   Understand these instructions.  Will watch your condition.  Will get help right away if you are not doing well or get worse. Document Released: 09/20/2007 Document Revised: 12/27/2011 Document Reviewed: 11/02/2011 [facility] Patient Information 2015 [place], [place]. This information is not intended to replace advice given to you by your health care provider. Make sure you discuss any questions you have with your health care provider.   Emergency Department Resource Guide 1) Find a Doctor and Pay Out of Pocket Although you won't have to find  out who is covered by your insurance plan, it is a good idea to ask around and get recommendations. You will then need to call the office and see if the doctor you have chosen will accept you as a new patient and what types of options they offer for patients who are self-pay. Some doctors offer discounts or will set up payment plans for their patients who do not have insurance, but you will need to ask so you aren't surprised when you get to your appointment.  2) Contact Your Local Health Department Not all health departments have doctors that can see patients for sick visits, but many do, so it is worth a call to see if yours does. If you don't know where your local health department is, you can  check in your phone book. The CDC also has a tool to help you locate your state's health department, and many state websites also have listings of all of their local health departments.  3) Find a Walk-in Clinic If your illness is not likely to be very severe or complicated, you may want to try a walk in clinic. These are popping up all over the country in pharmacies, drugstores, and shopping centers. They're usually staffed by nurse practitioners or physician assistants that have been trained to treat common illnesses and complaints. They're usually fairly quick and inexpensive. However, if you have serious medical issues or chronic medical problems, these are probably not your best option.  No Primary Care Doctor: - Call Health Connect at  [phone] - they can help you locate a primary care doctor that  accepts your insurance, provides certain services, etc. - Physician Referral Service- [phone]  Chronic Pain Problems: Organization         Address  Phone   Notes  [facility]  [phone] Patients need to be referred by their primary care doctor.   Medication Assistance: Organization         Address  Phone   Notes  [facility] Medication [facility] [address] [place], [place] [address] [phone] --Must be a resident of [facility] -- Must have NO insurance coverage whatsoever (no Medicaid/ Medicare, etc.) -- The pt. MUST have a primary care doctor that directs their care regularly and follows them in the community   Med[facility]ist  [phone]   [employer]  [phone]    Agencies that provide inexpensive medical care: Organization         Address  Phone   Notes  [facility]  [phone]   [facility]    [phone]   [facility] [address] [place], [place] [address] [phone]   [facility] [address], [place] [phone]    [facility]anned Parenthood    [phone]   [facility]    [phone]   [facility]  [address], [place] Phone:  [phone], Fax:  [phone] Hours of Operation:  9 am - 6 pm, M-F.  Also accepts Medicaid/Medicare and self-pay.  [facility]  [address], [place] Phone: [phone], Fax: [phone]. Hours of Operation:  8:30 am - 5:30 pm, M-F.  Also accepts Medicaid and self-pay.  [facility] [place] [address], [place] Phone: [phone]) [phone]   [facility] [address], [place],  [place] [phone], Ext. [phone] Mondays & Thursdays: 7-9 AM.  First 15 patients are seen on a first come, first serve basis.    Medicaid-accepting [facility] Providers:  Organization         Address  Phone   Notes  [facility] [address], [place] [phone] Also accepts self-pay patients.  [facility] [address] Laurell Josephs [place], [place]  [phone]   [facility] [address], [place] [phone]   [facility] [address] [place] [phone]   Renaye Rakers [address], [place]   [phone] Only accepts [place] Access [place] patients after they have their name applied to their card.   Self-Pay (no insurance) in [facility]:  Organization         Address  Phone   Notes  Sickle Cell Patients, [facility] [address], [place] [phone]   [facility] [address] [place], [place] [phone]   [facility] [place]  [address], [place] [phone]   [facility]/Dr. Osei-Bonsu  [address], [place] or [address], [place] [phone] Phone number for both [place] and [place] locations is the  same.  [facility]gent Medical and [facility] [address], [place] [phone]   [facility] [address], [place] or [address] [phone]   [facility] [address], [place] [phone], phone; [phone], fax Sees patients 1st and 3rd Saturday of every month.  Must not qualify for public or private insurance (i.e. Medicaid, Medicare, [place] Health Choice, Veterans' Benefits)  Household income should be no more than 200% of the poverty level The clinic cannot treat you if you are pregnant or think you are pregnant  Sexually transmitted diseases are not treated at the clinic.    Dental Care: Organization         Address  Phone  Notes  [facility] [address] [place], [place] [phone] Accepts children up to age 12 who are enrolled in [place] or [place] Health Choice; pregnant women with a Medicaid card; and children who have applied for Medicaid or [place] Health Choice, but were declined, whose parents can pay a reduced fee at time of service.  [facility] Department of [place]  [address], [place] [phone] Accepts children up to age 34 who are enrolled in [place] or [place] Health Choice; pregnant women with a Medicaid card; and children who have applied for Medicaid or  Health Choice, but were declined, whose parents can pay a reduced fee at time of service.  [facility]  [address] [place], [place] [phone] Patients are seen by appointment only. Walk-ins are not accepted. [facility] will see patients 21 years of age and older. Monday - Tuesday (8am-5pm) Most Wednesdays (8:30-5pm) $30 per visit, cash only  [facility]  [address], [facility] [phone] Patients are seen by appointment only. Walk-ins are not accepted. [facility] will see patients  84 years of age and older. One Wednesday Evening (Monthly: Volunteer Based).  $30 per visit, cash only  [employer]  (  [phone]) [phone] for adults; Children under age 314, call Graduate Pediatric Dentistry at [phone]. Children aged 254-14, please call [phone]) [phone] to request a pediatric application.  Dental services are provided in all areas of dental care including fillings, crowns and bridges, complete and partial dentures, implants, gum treatment, root canals, and extractions. Preventive care is also provided. Treatment is provided to both adults and children. Patients are selected via a lottery and there is often a waiting list.   [facility] [address], [place]  [phone] [email or web address]   [facility] [address], [place], [place] [phone], Ext. [phone] Second and Fourth Thursday of each month, opens at 6:30 AM; Clinic ends at 9 AM.  Patients are seen on a first-come first-served basis, and a limited number are seen during each clinic.   [facility]  [address], [place], [place] [phone]) [phone]   Eligibility Requirements You must have lived in [place], [place], or [place] counties for at least the last three months.   You cannot be eligible for state or federal sponsored National Cityhealthcare insurance, including CIGNAVeterans Administration, [place]icaid, or [employer]icare.   You generally cannot be eligible for healthcare insurance through your employer.    How to apply: Eligibility screenings are held every Tuesday and Wednesday afternoon from 1:00 pm until 4:00 pm. You do not need an appointment for the interview!  [facility] [address], [place], [place] [phone]   [facility]  [phone]   [facility]  [phone]   [facility]  [phone]    Behavioral Health Resources in the Community: Intensive Outpatient  Programs Organization         Address  Phone  Notes  [facility] [address], [place], [place] [phone]   [facility] [address], [place], [place] [phone]   [facility] [address], [place], [place]  [phone]   [facility] [address],  [place], [place] [phone] or [phone]   Substance Abuse Resources Organization         Address  Phone  Notes  [facility]  [phone]   [facility]  [phone]   [facility]  [phone]   [facility]  [phone]   Residential & Outpatient Substance Abuse Program  [phone]   Psychological Services Organization         Address  Phone  Notes  [facility]  [phone]   [facility]  [phone]- [phone]   [facility] [address], [place] [phone] or [phone]    Mobile Crisis Teams Organization         Address  Phone  Notes  [facility], Mobile Crisis Care Unit  [phone]   [facility]  [address] [place], [place] [phone]   Doristine LocksSharon DeEsch [address] [place] [phone]    Self-Help/Support Groups Organization         Address  Phone             Notes  [facility] [place] - variety of support groups  [phone] Call for more information  Narcotics Anonymous (NA), Caring [facility] [address], [place]  2 meetings at this location   Chief Executive Officeresidential Treatment Programs Organization         Address  Phone  Notes  [facility] [address]    [place]  [phone]   [facility]  [address] [place] [address]191, [place], [place] [phone]   [facility] [address] [place], [place] [phone] Admissions: 8am-3pm M-F  [facility] [address]    [place], [place]  [phone]   [facility] [address] [place], [place], [place] [phone]   [facility] [address],  [place], [place] [phone]   [facility] - Intensive Outpatient [address] Laurell Josephs [address], [place], [place] [phone]   [facility] ([facility] [address]  [place], [place] [phone] or [phone]   [facility] ([facility]) [address] [place], [place] [phone] Accepts Medicaid  [facility] [address],  [place] [phone] Substance Abuse/Addiction Treatment   [facility] Organization         Address  Phone  Notes  [facility]  [phone]   Angie Fava, PhD [address] Ervin Knack [place], [place]   [phone] or [phone]   [facility]   [address] [place], [place] [phone]   [facility] [address], [place], [place] [phone] Insurance/Medicaid/sponsorship through [facility] and Families [address]                                    [place], [place] [phone] Therapy/tele-psych/case  [facility] [address] [place], [place] [phone]    Dr. Lolly Mustache  [phone]   [facility]  [employer] [facility] Dept. 1) [address] [place] 2) [address] [place] 3)  [address], [place] [phone]  [phone]   [facility] Child Abuse Hotline [phone] or [phone] (After Hours)
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
## 2014-01-28 LAB — URINE CULTURE

## 2014-06-23 ENCOUNTER — Emergency Department (HOSPITAL_COMMUNITY)
Admission: EM | Admit: 2014-06-23 | Discharge: 2014-06-24 | Disposition: A | Discharge status: Home / Self Care | Payer: Medicare Other | Attending: Emergency Medicine | Admitting: Emergency Medicine

## 2014-06-23 ENCOUNTER — Encounter (HOSPITAL_COMMUNITY): Payer: Self-pay | Admitting: Emergency Medicine

## 2014-06-23 DIAGNOSIS — Z792 Long term (current) use of antibiotics: Secondary | ICD-10-CM | POA: Diagnosis not present

## 2014-06-23 DIAGNOSIS — Y9289 Other specified places as the place of occurrence of the external cause: Secondary | ICD-10-CM | POA: Diagnosis not present

## 2014-06-23 DIAGNOSIS — G8929 Other chronic pain: Secondary | ICD-10-CM

## 2014-06-23 DIAGNOSIS — Z720 Tobacco use: Secondary | ICD-10-CM | POA: Diagnosis not present

## 2014-06-23 DIAGNOSIS — G40909 Epilepsy, unspecified, not intractable, without status epilepticus: Secondary | ICD-10-CM | POA: Insufficient documentation

## 2014-06-23 DIAGNOSIS — R45851 Suicidal ideations: Secondary | ICD-10-CM | POA: Insufficient documentation

## 2014-06-23 DIAGNOSIS — Z87828 Personal history of other (healed) physical injury and trauma: Secondary | ICD-10-CM | POA: Diagnosis not present

## 2014-06-23 DIAGNOSIS — Y998 Other external cause status: Secondary | ICD-10-CM | POA: Diagnosis not present

## 2014-06-23 DIAGNOSIS — Y9389 Activity, other specified: Secondary | ICD-10-CM | POA: Diagnosis not present

## 2014-06-23 DIAGNOSIS — Z79899 Other long term (current) drug therapy: Secondary | ICD-10-CM | POA: Insufficient documentation

## 2014-06-23 DIAGNOSIS — X788XXA Intentional self-harm by other sharp object, initial encounter: Secondary | ICD-10-CM | POA: Insufficient documentation

## 2014-06-23 DIAGNOSIS — S90415A Abrasion, left lesser toe(s), initial encounter: Secondary | ICD-10-CM | POA: Diagnosis not present

## 2014-06-23 DIAGNOSIS — F32A Depression, unspecified: Secondary | ICD-10-CM

## 2014-06-23 DIAGNOSIS — F329 Major depressive disorder, single episode, unspecified: Secondary | ICD-10-CM

## 2014-06-23 DIAGNOSIS — S99922A Unspecified injury of left foot, initial encounter: Secondary | ICD-10-CM | POA: Diagnosis present

## 2014-06-23 LAB — CBC
HEMATOCRIT: 43.4 % (ref 39.0–52.0)
HEMOGLOBIN: 14.9 g/dL (ref 13.0–17.0)
MCH: 30.2 pg (ref 26.0–34.0)
MCHC: 34.3 g/dL (ref 30.0–36.0)
MCV: 87.9 fL (ref 78.0–100.0)
Platelets: 251 10*3/uL (ref 150–400)
RBC: 4.94 MIL/uL (ref 4.22–5.81)
RDW: 13.8 % (ref 11.5–15.5)
WBC: 10.5 10*3/uL (ref 4.0–10.5)
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
## 2014-06-23 NOTE — ED Notes (Signed)
Staffing and charge RN aware of need for sitter- pt changed into paper scrubs and belongings removed from room.

## 2014-06-23 NOTE — ED Notes (Signed)
[facility] reports pain has been severe in left foot for the past week.  Pt reports that he attempted to cut his left forth toe off, small puncture wound noted with bleeding controlled.  Pt admits to thoughts of suicide because pain is unbearable, denies plan, denies HI.  Denies ETOH or drug use.

## 2014-06-24 LAB — COMPREHENSIVE METABOLIC PANEL
ALT: 27 U/L (ref 0–53)
AST: 33 U/L (ref 0–37)
Albumin: 3.9 g/dL (ref 3.5–5.2)
Alkaline Phosphatase: 81 U/L (ref 39–117)
Anion gap: 13 (ref 5–15)
BILIRUBIN TOTAL: 0.5 mg/dL (ref 0.3–1.2)
BUN: 13 mg/dL (ref 6–23)
CO2: 19 mmol/L (ref 19–32)
Calcium: 9.5 mg/dL (ref 8.4–10.5)
Chloride: 106 mmol/L (ref 96–112)
Creatinine, Ser: 0.99 mg/dL (ref 0.50–1.35)
GFR calc Af Amer: >90 mL/min (ref >90)
GFR calc non Af Amer: >90 mL/min (ref >90)
Glucose, Bld: 92 mg/dL (ref 70–99)
POTASSIUM: 3.4 mmol/L — AB (ref 3.5–5.1)
SODIUM: 138 mmol/L (ref 135–145)
TOTAL PROTEIN: 7 g/dL (ref 6.0–8.3)

## 2014-06-24 LAB — ACETAMINOPHEN LEVEL

## 2014-06-24 LAB — ETHANOL

## 2014-06-24 LAB — SALICYLATE LEVEL: Salicylate Lvl: <4 mg/dL (ref 2.8–20.0)

## 2014-06-24 MED ORDER — ONDANSETRON HCL 4 MG PO TABS: 4.0000 mg | ORAL_TABLET | Freq: Three times a day (TID) | ORAL | Status: DC | PRN

## 2014-06-24 MED ORDER — LORAZEPAM 1 MG PO TABS: 1.0000 mg | ORAL_TABLET | Freq: Three times a day (TID) | ORAL | Status: DC | PRN

## 2014-06-24 MED ORDER — PREGABALIN 50 MG PO CAPS
100.0000 mg | ORAL_CAPSULE | Freq: Two times a day (BID) | ORAL | Status: DC
  Administered 2014-06-24: 100 mg via ORAL
  Filled 2014-06-24: qty 2

## 2014-06-24 MED ORDER — NICOTINE 21 MG/24HR TD PT24: 21.0000 mg | MEDICATED_PATCH | Freq: Every day | TRANSDERMAL | Status: DC

## 2014-06-24 MED ORDER — IBUPROFEN 400 MG PO TABS
600.0000 mg | ORAL_TABLET | Freq: Three times a day (TID) | ORAL | Status: DC | PRN
  Administered 2014-06-24: 600 mg via ORAL
  Filled 2014-06-24 (×2): qty 1

## 2014-06-24 MED ORDER — ACETAMINOPHEN 325 MG PO TABS: 650.0000 mg | ORAL_TABLET | ORAL | Status: DC | PRN

## 2014-06-24 MED ORDER — ZOLPIDEM TARTRATE 5 MG PO TABS: 5.0000 mg | ORAL_TABLET | Freq: Every evening | ORAL | Status: DC | PRN

## 2014-06-24 MED ORDER — ALUM & MAG HYDROXIDE-SIMETH 200-200-20 MG/5ML PO SUSP: 30.0000 mL | ORAL | Status: DC | PRN

## 2014-06-24 NOTE — ED Notes (Signed)
Contract for safety signed by patient.
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
## 2014-06-24 NOTE — Discharge Instructions (Signed)

## 2014-06-24 NOTE — ED Notes (Signed)
TTS in progress 

## 2014-06-25 ENCOUNTER — Emergency Department (HOSPITAL_COMMUNITY)
Admission: EM | Admit: 2014-06-25 | Discharge: 2014-06-28 | Disposition: A | Discharge status: Home / Self Care | Payer: Medicare Other | Attending: Emergency Medicine | Admitting: Emergency Medicine

## 2014-06-25 ENCOUNTER — Encounter (HOSPITAL_COMMUNITY): Payer: Self-pay | Admitting: Physical Medicine and Rehabilitation

## 2014-06-25 ENCOUNTER — Emergency Department (HOSPITAL_COMMUNITY): Payer: Medicare Other

## 2014-06-25 DIAGNOSIS — G709 Myoneural disorder, unspecified: Secondary | ICD-10-CM | POA: Insufficient documentation

## 2014-06-25 DIAGNOSIS — G40909 Epilepsy, unspecified, not intractable, without status epilepticus: Secondary | ICD-10-CM | POA: Insufficient documentation

## 2014-06-25 DIAGNOSIS — Z9889 Other specified postprocedural states: Secondary | ICD-10-CM | POA: Diagnosis not present

## 2014-06-25 DIAGNOSIS — R4589 Other symptoms and signs involving emotional state: Secondary | ICD-10-CM | POA: Diagnosis present

## 2014-06-25 DIAGNOSIS — G8929 Other chronic pain: Secondary | ICD-10-CM | POA: Insufficient documentation

## 2014-06-25 DIAGNOSIS — R45851 Suicidal ideations: Secondary | ICD-10-CM | POA: Diagnosis not present

## 2014-06-25 DIAGNOSIS — Z23 Encounter for immunization: Secondary | ICD-10-CM | POA: Insufficient documentation

## 2014-06-25 DIAGNOSIS — Z720 Tobacco use: Secondary | ICD-10-CM | POA: Diagnosis not present

## 2014-06-25 DIAGNOSIS — T148XXA Other injury of unspecified body region, initial encounter: Secondary | ICD-10-CM

## 2014-06-25 DIAGNOSIS — Z87828 Personal history of other (healed) physical injury and trauma: Secondary | ICD-10-CM | POA: Insufficient documentation

## 2014-06-25 DIAGNOSIS — Z79899 Other long term (current) drug therapy: Secondary | ICD-10-CM | POA: Diagnosis not present

## 2014-06-25 DIAGNOSIS — Z792 Long term (current) use of antibiotics: Secondary | ICD-10-CM | POA: Insufficient documentation

## 2014-06-25 DIAGNOSIS — M79672 Pain in left foot: Secondary | ICD-10-CM | POA: Diagnosis present

## 2014-06-25 DIAGNOSIS — M791 Myalgia: Secondary | ICD-10-CM | POA: Diagnosis not present

## 2014-06-25 LAB — CBC WITH DIFFERENTIAL/PLATELET
Basophils Absolute: 0.1 10*3/uL (ref 0.0–0.1)
Basophils Relative: 1 % (ref 0–1)
Eosinophils Absolute: 0.3 10*3/uL (ref 0.0–0.7)
Eosinophils Relative: 3 % (ref 0–5)
HEMATOCRIT: 42.3 % (ref 39.0–52.0)
HEMOGLOBIN: 14.5 g/dL (ref 13.0–17.0)
LYMPHS ABS: 2.7 10*3/uL (ref 0.7–4.0)
LYMPHS PCT: 29 % (ref 12–46)
MCH: 30.3 pg (ref 26.0–34.0)
MCHC: 34.3 g/dL (ref 30.0–36.0)
MCV: 88.3 fL (ref 78.0–100.0)
Monocytes Absolute: 0.6 10*3/uL (ref 0.1–1.0)
Monocytes Relative: 6 % (ref 3–12)
NEUTROS PCT: 63 % (ref 43–77)
Neutro Abs: 5.9 10*3/uL (ref 1.7–7.7)
Platelets: 245 10*3/uL (ref 150–400)
RBC: 4.79 MIL/uL (ref 4.22–5.81)
RDW: 13.8 % (ref 11.5–15.5)
WBC: 9.5 10*3/uL (ref 4.0–10.5)

## 2014-06-25 LAB — COMPREHENSIVE METABOLIC PANEL
ALBUMIN: 4.2 g/dL (ref 3.5–5.2)
ALT: 26 U/L (ref 0–53)
ANION GAP: 10 (ref 5–15)
AST: 26 U/L (ref 0–37)
Alkaline Phosphatase: 84 U/L (ref 39–117)
BUN: 10 mg/dL (ref 6–23)
CO2: 22 mmol/L (ref 19–32)
Calcium: 9.3 mg/dL (ref 8.4–10.5)
Chloride: 109 mmol/L (ref 96–112)
Creatinine, Ser: 0.93 mg/dL (ref 0.50–1.35)
GFR calc Af Amer: >90 mL/min (ref >90)
GLUCOSE: 94 mg/dL (ref 70–99)
POTASSIUM: 3.7 mmol/L (ref 3.5–5.1)
SODIUM: 141 mmol/L (ref 135–145)
TOTAL PROTEIN: 7.5 g/dL (ref 6.0–8.3)
Total Bilirubin: 0.3 mg/dL (ref 0.3–1.2)

## 2014-06-25 LAB — RAPID URINE DRUG SCREEN, HOSP PERFORMED
Amphetamines: NOT DETECTED
BENZODIAZEPINES: POSITIVE — AB
Barbiturates: NOT DETECTED
Cocaine: NOT DETECTED
Opiates: NOT DETECTED
Tetrahydrocannabinol: POSITIVE — AB

## 2014-06-25 LAB — ETHANOL

## 2014-06-25 MED ORDER — ONDANSETRON HCL 4 MG PO TABS: 4.0000 mg | ORAL_TABLET | Freq: Three times a day (TID) | ORAL | Status: DC | PRN

## 2014-06-25 MED ORDER — NICOTINE 21 MG/24HR TD PT24
21.0000 mg | MEDICATED_PATCH | Freq: Every day | TRANSDERMAL | Status: DC
  Administered 2014-06-25 – 2014-06-28 (×4): 21 mg via TRANSDERMAL
  Filled 2014-06-25 (×4): qty 1

## 2014-06-25 MED ORDER — IBUPROFEN 200 MG PO TABS: 600.0000 mg | ORAL_TABLET | Freq: Three times a day (TID) | ORAL | Status: DC | PRN

## 2014-06-25 MED ORDER — ACETAMINOPHEN 325 MG PO TABS: 650.0000 mg | ORAL_TABLET | ORAL | Status: DC | PRN

## 2014-06-25 MED ORDER — TETANUS-DIPHTH-ACELL PERTUSSIS 5-2.5-18.5 LF-MCG/0.5 IM SUSP
0.5000 mL | Freq: Once | INTRAMUSCULAR | Status: AC
  Administered 2014-06-25: 0.5 mL via INTRAMUSCULAR
  Filled 2014-06-25: qty 0.5

## 2014-06-25 MED ORDER — ZOLPIDEM TARTRATE 5 MG PO TABS: 5.0000 mg | ORAL_TABLET | Freq: Every evening | ORAL | Status: DC | PRN

## 2014-06-25 MED ORDER — ALUM & MAG HYDROXIDE-SIMETH 200-200-20 MG/5ML PO SUSP: 30.0000 mL | ORAL | Status: DC | PRN

## 2014-06-25 NOTE — ED Notes (Addendum)
Pt states "I have pain in my foot and I feel like I want to cut my foot off because it hurts." denies SI. States that he is on medication for his pain but that he has run out of it.

## 2014-06-25 NOTE — ED Notes (Signed)
Staffing called for sitter for patient.

## 2014-06-25 NOTE — ED Notes (Signed)
Security at bedside to wand patient. 

## 2014-06-25 NOTE — ED Notes (Signed)
Pt presents to department for evaluation of chronic pain to L lower leg and foot. Ongoing for several years. Pt currently goes to pain clinic, but states no relief of symptoms. States "I don't want to take pain medications, I am not good with pills." was seen for same on Tuesday night and prescribed ibuprofen, but states no relief of pain. States he wants to speak with psychologist because he feels depressed when his pain is severe. Denies SI/HI at the time. Pt states "I don't want to hurt myself or anyone else until my pain gets bad." pt is alert and oriented x4.

## 2014-06-25 NOTE — ED Notes (Signed)
Pt placed in paper scrubs, belongings sent home with patients aunt.

## 2014-06-25 NOTE — ED Notes (Signed)
Pt's belongings inventoried  
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
## 2014-06-25 NOTE — ED Notes (Signed)
Charge RN notified of pt being placed under psych hold and needing SI sitter at this time.

## 2014-06-25 NOTE — ED Notes (Signed)
[facility] telepys[facility] at bedside.
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
## 2014-06-25 NOTE — ED Notes (Signed)
SI sitter at bedside  

## 2014-06-25 NOTE — ED Notes (Signed)
PA at bedside.

## 2014-06-26 LAB — URINE MICROSCOPIC-ADD ON

## 2014-06-26 LAB — URINALYSIS, ROUTINE W REFLEX MICROSCOPIC
BILIRUBIN URINE: NEGATIVE
Glucose, UA: NEGATIVE mg/dL
KETONES UR: NEGATIVE mg/dL
NITRITE: POSITIVE — AB
Specific Gravity, Urine: 1.035 — ABNORMAL HIGH (ref 1.005–1.030)
Urobilinogen, UA: 0.2 mg/dL (ref 0.0–1.0)
pH: 6.5 (ref 5.0–8.0)

## 2014-06-26 MED ORDER — LEVETIRACETAM 500 MG PO TABS
500.0000 mg | ORAL_TABLET | Freq: Two times a day (BID) | ORAL | Status: DC
  Administered 2014-06-26 – 2014-06-28 (×6): 500 mg via ORAL
  Filled 2014-06-26 (×9): qty 1

## 2014-06-26 MED ORDER — GABAPENTIN 400 MG PO CAPS
800.0000 mg | ORAL_CAPSULE | Freq: Three times a day (TID) | ORAL | Status: DC
  Administered 2014-06-26 – 2014-06-28 (×8): 800 mg via ORAL
  Filled 2014-06-26 (×9): qty 2

## 2014-06-26 MED ORDER — SULFAMETHOXAZOLE-TRIMETHOPRIM 800-160 MG PO TABS
1.0000 | ORAL_TABLET | Freq: Two times a day (BID) | ORAL | Status: DC
  Administered 2014-06-26 – 2014-06-28 (×4): 1 via ORAL
  Filled 2014-06-26 (×4): qty 1

## 2014-06-26 MED ORDER — SIMVASTATIN 40 MG PO TABS
40.0000 mg | ORAL_TABLET | Freq: Every day | ORAL | Status: DC
  Administered 2014-06-26 – 2014-06-27 (×3): 40 mg via ORAL
  Filled 2014-06-26 (×5): qty 1

## 2014-06-26 NOTE — Progress Notes (Addendum)
CSW to follow up patient referral at the following hospitals that pt has been referred: [facility] - at capacity per Jeris PentaKelvin [name] - has low acuity beds [facility] - no asnwer [facility] - no answer.  [facility] - no answer.  CSW faxed patient referral to the following hospitals with potential bed: [name], [facility], [facility], [facility], [facility], [facility] and [facility].  CSW contacted the following hospitals with potential bed: [facility] - accepts referrals [facility] - at capacity but will place on waitlist. [facility] has only male adult beds and male geriatric beds this evening. [facility] - at capacity, but will take a look at referral. Follow up in pm.  [facility] - has low acuity beds, will be able to follow up after shift change 1900. Referral faxed. [name] - has adult beds, no child beds.  [facility] - has beds, referral under review, will call us back after taking a look at it. CSW will continue to seek placement. [name] - at capacity but will take a look at referral.  Hospitals at capacity: [facility] - at capacity per [facility] - no answer. Per morning shift hospital was at capacity. [facility] - Per [facility], at capacity for adults, might have one bed opening for child under 11. [facility] - no answer.  [facility]   Will continue to seek placement.  Melbourne Abtsatia Kaige Whistler, LCSWA Disposition staff 06/26/2014 11:32 PM

## 2014-06-26 NOTE — ED Provider Notes (Signed)
Resting comfortably this morning. No overnight problems per nursing staff.  Filed Vitals:   06/26/14 0648  BP: 99/60  Pulse: 72  Temp: 98.3 F (36.8 C)  Resp: 18     Gilda Creasehristopher J Kalif Kattner, MD 06/26/14 1021

## 2014-06-26 NOTE — ED Notes (Addendum)
Pt [facility]. Pt self-cathing at this time. Denies needs at this time. Meal tray at bedside

## 2014-06-26 NOTE — ED Provider Notes (Signed)
Patient noted to have urinary tract infection. Bactrim DS one by mouth every 12 hours for 7 days ordered patient resting comfortably at 8:15 PM  Doug SouSam Sabastian Raimondi, MD 06/26/14 2017

## 2014-06-26 NOTE — BH Assessment (Signed)
Seeking placement, sent referrals to: [name]  Deepa Barthel, [place] Triage Specialist 06/26/2014 4:38 AM

## 2014-06-26 NOTE — ED Notes (Signed)
Pt ate all of dinner meal.  

## 2014-06-27 MED ORDER — PREGABALIN 50 MG PO CAPS
100.0000 mg | ORAL_CAPSULE | Freq: Two times a day (BID) | ORAL | Status: DC
  Administered 2014-06-27 – 2014-06-28 (×3): 100 mg via ORAL
  Filled 2014-06-27 (×3): qty 2

## 2014-06-27 NOTE — Progress Notes (Signed)
CSW follow up on placement attempts made by previous shift and made new referrals.  Declined: [name]: [name] due medical [name]: [name] due to no appropriate bed [name]: [name] due to lack of acuity per doctor   Pending: [name]: [name] (Refaxed as did not have referral) [name]: [name] have referrral will review today  [name]: [name] reports to review today  [name]: Reports have referral and to review today   Waitlist For Acceptance Pending Available Bed: [facility] per [facility]   At Capacity: [facility]: [name] [place]: [name] [name]: [name] reported did not have referral but did not have beds today [facility]  [facility]    CSW will monitor.  [name], LCSW Disposition Social Worker [phone]

## 2014-06-27 NOTE — Progress Notes (Signed)
CSW contacted the patient's Aunt who states she will come to visit the patient at 1730 and will bring the patients [place] card, his ID number is: [id number] L.  Patient's Aunt was tearful on the phone when talking about her nephew.  The Aunt state his biggest problem is his chronic pain and that, "The pain, honey, the pain.  Can they do anything.  That is what causes him the depression and has him thinking about cutting his foot off.  He gets the medication and he takes more than he should, because the medication doesn't work as effectively as it did.  He may need someone to give him his medications."  CSW informed patient his Aunt would be visiting.  [facility] Annayah Worthley Macy MisLCSW,LCAS [place] ED CSW [phone]

## 2014-06-27 NOTE — ED Provider Notes (Signed)
Pt alert, nad.   [facility] - placement pending.  Recent urine culture remains pending.  Dispo per psych team.       Cathren LaineKevin Aidyn Sportsman, MD 06/27/14 1248

## 2014-06-27 NOTE — BHH Counselor (Signed)
Brook MansfieldMcNichol, [facility] at [facility], confirms adult unit is currently at capacity. Contacted the following facilities for placement:  INFORMATION HAS BEEN FAXED, AWAITING RESPONSE: [facility], per [name], per LawrenceNekia  PT ON WAIT LIST: [facility]  AT CAPACITY: [facility], per [facility], per HolcombJennifer (No beds until Monday) [facility], per Oswaldo DoneElva [facility], per [facility], per [name], per [name], per Paducaholleen (No beds until Monday) [facility], per [facility], per [facility], per AnatoneMelanie (No beds until Monday) [facility], per [facility], per [facility], per [facility], per Ron [name], per [facility], per [facility], per [facility], per Jonah  PT DECLINED: [name] [address]   Braylen Staller Ellis Jasdeep Kepner Jr, WisconsinLPC, [facility] Triage Specialist [phone]

## 2014-06-27 NOTE — Progress Notes (Signed)
LCSW reassessed patient. Patient denies current SI but reports that he is suicidal and wants to cut his foot off when he is in pain and he is not currently in pain. Patient reports that the pain "comes and goes and it has different levels" and it has been at a "low" for the past two days and he will let staff know once it gets high. Patient denies HI and psychosis and reports that he prefers to be placed locally due to transportation. Patient will continue to be observed and re evaluated by psychiatry.

## 2014-06-27 NOTE — ED Notes (Signed)
Patient talked at length about his frustrations with his nerve pain in hit left foot and lower leg. States he isolates and becomes more depressed when his pain gets worse. He admits that he does not take his medications as prescribed all the time. States when he has more pain in his foot he takes the pain meds to be "dazed" so he doesn't hurt. He also verbalizes frustration at the [facility] because he cant see the same doctor each visit. He also speaks of transportation issues to get his needed appointments scheduled. Social work consulted to offer info on possible transportation options.

## 2014-06-28 DIAGNOSIS — F329 Major depressive disorder, single episode, unspecified: Secondary | ICD-10-CM

## 2014-06-28 DIAGNOSIS — R4589 Other symptoms and signs involving emotional state: Secondary | ICD-10-CM | POA: Diagnosis present

## 2014-06-28 NOTE — Discharge Instructions (Signed)
°Emergency Department Resource Guide °1) Find a Doctor and Pay Out of Pocket °Although you won't have to find out who is covered by your insurance plan, it is a good idea to ask around and get recommendations. You will then need to call the office and see if the doctor you have chosen will accept you as a new patient and what types of options they offer for patients who are self-pay. Some doctors offer discounts or will set up payment plans for their patients who do not have insurance, but you will need to ask so you aren't surprised when you get to your appointment. ° °2) Contact Your Local Health Department °Not all health departments have doctors that can see patients for sick visits, but many do, so it is worth a call to see if yours does. If you don't know where your local health department is, you can check in your phone book. The CDC also has a tool to help you locate your state's health department, and many state websites also have listings of all of their local health departments. ° °3) Find a Walk-in Clinic °If your illness is not likely to be very severe or complicated, you may want to try a walk in clinic. These are popping up all over the country in pharmacies, drugstores, and shopping centers. They're usually staffed by nurse practitioners or physician assistants that have been trained to treat common illnesses and complaints. They're usually fairly quick and inexpensive. However, if you have serious medical issues or chronic medical problems, these are probably not your best option. ° °No Primary Care Doctor: °- Call [facility] at  832-8000 - they can help you locate a primary care doctor that  accepts your insurance, provides certain services, etc. °- Physician Referral Service- 1-800-533-3463 ° °Chronic Pain Problems: °Organization         Address  Phone   Notes  °[facility]  (336) 297-2271 Patients need to be referred by their primary care doctor.  ° °Medication  Assistance: °Organization         Address  Phone   Notes  °[place] [facility] [address] °[place], [place] [address] (336) 641-8030 --Must be a resident of [place] °-- Must have NO insurance coverage whatsoever (no Medicaid/ Medicare, etc.) °-- The pt. MUST have a primary care doctor that directs their care regularly and follows them in the community °  °[facility]  (866) 331-1348   °[employer]  (888) 892-1162   ° °Agencies that provide inexpensive medical care: °Organization         Address  Phone   Notes  °[facility]  (336) 832-8035   °[facility]    (336) 832-7272   °[facility] [address] °[place],  [address] (336) 832-4777   °[facility] [address], °[place] (336) 271-4999   °[facility]    (336) 373-0678   °[facility]    (336) 272-1050   °[facility] ° [address], [place] Phone:  (336) 832-4444, Fax:  (336) 832-4440 Hours of Operation:  9 am - 6 pm, M-F.  Also accepts Medicaid/Medicare and self-pay.  °[facility] ° [address],  Phone: (336) 832-3150, Fax: (336) 832-3151. Hours of Operation:  8:30 am - 5:30 pm, M-F.  Also accepts Medicaid and self-pay.  °[facility] [place] [address], [place] Phone: (336) 878-6027   °[facility] [address], [place], [place] (336)723-1848, Ext. 123 Mondays & Thursdays: 7-9 AM.  First 15 patients are seen on a first come, first serve basis. °  ° °Medicaid-accepting [place] Providers: ° °Organization         Address  Phone   Notes  °[facility] [address], [place] (336) 641-2100 Also accepts self-pay patients.  °[facility] [address], [place] ° (336) 856-9996   °[facility] [address], [place]  (336) 288-8857   °[facility] [address] [place] [address], [place] (336) 299-7000   °Veita Bland [address], [place]  ° (336) 373-1557 Only accepts [place] Access Medicaid patients after they have their name applied to their card.  ° °Self-Pay (no insurance) in [place]: ° °Organization         Address  Phone   Notes  °Sickle Cell Patients, [facility] [address], [place] (336) 832-1970   °[facility] [address], [place] (336) 832-4400   °[facility] [place] ° [address], [place] (336) 992-4800   °[facility]/Dr. Osei-Bonsu ° [address] [place] [address], [place] or [address], [place] (336) 841-8500 Phone number for both [place] and [place] locations is the same.  °[facility] [address], [place] (336) 299-0000   °[facility] [address] [place] [address], [place] or [address] (336) 852-7530 °(336) 878-2260   °[facility] [address], [place] (336) 350-1642, phone; (336) 294-5005, fax Sees patients 1st and 3rd Saturday of every month.  Must not qualify for public or private insurance (i.e. Medicaid, Medicare, [place] Health Choice, Veterans' Benefits) • Household income should be no more than 200% of the poverty level •The clinic cannot treat you if you are pregnant or think you are pregnant • Sexually transmitted diseases are not treated at the clinic.  ° ° °Dental Care: °Organization         Address  Phone  Notes  °[place] [facility] [address],  (336) 641-6152 Accepts children up to age 21 who are enrolled in Medicaid or [place] Health Choice; pregnant women with a Medicaid card; and children who have applied for Medicaid or [place] Health Choice, but were declined, whose parents can pay a reduced fee at time of service.  °[place]  [facility] [place]  [address], [place] (336) 641-7733 Accepts children up to age 21 who are enrolled in Medicaid or [place] Health Choice; pregnant women with a Medicaid card; and children who have applied for Medicaid or [place] Health Choice, but were declined, whose parents can pay a reduced fee at time of service.  °[facility] ° [address],  (336) 641-4533 Patients are seen by appointment only. Walk-ins are not accepted. [facility] will see patients 18 years of age and older. °Monday - Tuesday (8am-5pm) °Most Wednesdays (8:30-5pm) °$30 per visit, cash only  °[facility] ° [address], [place] (336) 641-4533 Patients are seen by appointment only. Walk-ins are not accepted. [facility] will see patients 18 years of age and older. °One   Wednesday Evening (Monthly: Volunteer Based).  $30 per visit, cash only  °[facility]  (919) 537-3737 for adults; Children under age 4, call Graduate Pediatric Dentistry at (919) [phone]. Children aged 4-14, please call (919) 537-3737 to request a pediatric application. ° Dental services are provided in all areas of dental care including fillings, crowns and bridges, complete and partial dentures, implants, gum treatment, root canals, and extractions. Preventive care is also provided. Treatment is provided to both adults and children. °Patients are selected via a lottery and there is often a waiting list. °  °[facility] [address], °[place] ° (336) 763-8833 [email or web address] °  °[facility] [address], [place], [place] (336)723-1848, Ext. 123 Second and Fourth Thursday of each month, opens at 6:30 AM; Clinic ends at 9 AM.  Patients are seen on a first-come first-served basis, and a limited number are seen during each clinic.  ° °[facility] ° [address], [place], [place] (336) 723-7904    Eligibility Requirements °You must have lived in [place], [place], or [place] counties for at least the last three months. °  You cannot be eligible for state or federal sponsored healthcare insurance, including Veterans Administration, Medicaid, or Medicare. °  You generally cannot be eligible for healthcare insurance through your employer.  °  How to apply: °Eligibility screenings are held every Tuesday and Wednesday afternoon from 1:00 pm until 4:00 pm. You do not need an appointment for the interview!  °[facility] [address], [place], [place] 336-631-2330   °[facility] [facility]  336-342-8273   °[place]  336-703-3100   ° [facility]  336-570-6415   ° °Behavioral Health Resources in the Community: °Intensive Outpatient Programs °Organization         Address  Phone  Notes  °[facility] [address], [place], [place] 336-878-6098   °[facility] [address], [place], [place] 336-832-9800   °[facility] [address], [place], [place] ° 336-882-2125   °[facility] [address],  °[place], [place] 1-800-853-5163 or 336-641-4981   °Substance Abuse Resources °Organization         Address  Phone  Notes  °[facility]  336-882-2125   °[facility]  336-784-9470   °[facility]  336-285-9073   °[facility]  336-845-3988   °Residential & Outpatient Substance Abuse Program  1-800-659-3381   °Psychological Services °Organization         Address  Phone  Notes  °[facility]  336- 832-9600   °[facility]  336- 378-7881   °[facility] [address], [place] 1-800-853-5163 or 336-641-4981   ° °Mobile Crisis Teams °Organization         Address  Phone  Notes  °[facility], Mobile Crisis Care Unit  1-877-626-1772   °Assertive °[facility] ° [address]  [place], [place] 336-834-9664   °Sharon DeEsch [address] °[place] 336-554-5454   ° °Self-Help/Support Groups °Organization         Address  Phone             Notes  °[facility] [place] - variety of support groups  336- 373-1402 Call for more information  °Narcotics Anonymous (NA), [facility] [address], °[place]  2 meetings at this location  ° °  Residential Treatment Programs °Organization         Address  Phone  Notes  °[facility] [address],    °[place]  1-866-801-8205   °[facility] ° [address], [place], [place] 704-293-8524   °[facility] [address], [place] 336-845-3988 Admissions: 8am-3pm M-F  °[facility] [address]    °[place], [place] 336-841-1104   °[facility] [address], [place], [place] 336-379-7146   °[facility] [address]  °[place], [place] 336-285-9073   °[facility] Intensive Outpatient [address], [place], [place] 336-852-3033   °[facility] [address]  °[place], [place] 1-877-615-2722 or 336-784-9470   °[facility] [address] [place], [place] 336-227-7417 Accepts Medicaid  °[facility] [address]  °[place] 1-800-659-3381 Substance Abuse/Addiction Treatment  ° °[place] Behavioral Health Resources °Organization         Address  Phone  Notes  °[facility]  (888) 581-9988   °Julie Brannon, PhD [address] [place], [place]   (336) 349-5553 or (336) 951-0000   °[facility]   [address] °[place], [place] (336) 349-4454   °[facility] [address], [place], [place] (336) 342-8316 Insurance/Medicaid/sponsorship through [facility]  °[facility] [address]                                    [place], [place] (336) 342-8316 Therapy/tele-psych/case    °[facility] [address]  ° [place], [place] (336) 349-2233    °Dr. Arfeen  (336) 349-4544   °[facility] [place]  [employer] [place] [facility] 1) [address], [place] °2) [address], [place] °3)  [address], [place] (336) 349-3220 °(336) 342-7768 ° °(336) 342-8140   °[place] [facility] (336) 342-1394 or (336) 342-3537 (After Hours)    ° ° °

## 2014-06-28 NOTE — ED Notes (Signed)
PT SIGNED "NO HARM CONTRACT" - FAXED COPY TO [facility]. PT ON PHONE CALLING AUNT ADVISING OF D/C PLAN.
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
## 2014-06-28 NOTE — ED Provider Notes (Signed)
Pt has been cleared by psych to go home, pt is in no acute distress, not suicidal  Thomas HongBrian Lannette Avellino, MD 06/28/14 [phone]

## 2014-06-28 NOTE — ED Notes (Signed)
Telepsych machine not working. Renata Capriceonrad, NP, [facility], talking w/pt on phone.
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
## 2014-06-29 LAB — URINE CULTURE: Colony Count: >=100000

## 2014-06-30 ENCOUNTER — Telehealth (HOSPITAL_BASED_OUTPATIENT_CLINIC_OR_DEPARTMENT_OTHER): Payer: Self-pay | Admitting: Emergency Medicine

## 2014-06-30 NOTE — Progress Notes (Signed)
ED Antimicrobial Stewardship Positive Culture Follow Up   Thomas Morales is an 39 y.o. male who [facility] with a chief complaint of  Chief Complaint  Patient presents with  . Depression  . Pain    Recent Results (from the past 720 hour(s))  Urine culture     Status: None   Collection Time: 06/26/14  3:55 PM  Result Value Ref Range Status   Specimen Description URINE, CATHETERIZED  Final   Special Requests NONE  Final   Colony Count   Final    >=100,000 COLONIES/ML Performed at [facility]    Culture   Final    ENTEROCOCCUS SPECIES STAPHYLOCOCCUS SPECIES (COAGULASE NEGATIVE) Note: RIFAMPIN AND GENTAMICIN SHOULD NOT BE USED AS SINGLE DRUGS FOR TREATMENT OF STAPH INFECTIONS. Performed at [facility]    Report Status 06/29/2014 FINAL  Final   Organism ID, Bacteria ENTEROCOCCUS SPECIES  Final   Organism ID, Bacteria STAPHYLOCOCCUS SPECIES (COAGULASE NEGATIVE)  Final      Susceptibility   Enterococcus species - MIC*    AMPICILLIN <=2 SENSITIVE Sensitive     LEVOFLOXACIN 2 SENSITIVE Sensitive     NITROFURANTOIN <=16 SENSITIVE Sensitive     VANCOMYCIN 1 SENSITIVE Sensitive     TETRACYCLINE >=16 RESISTANT Resistant     * ENTEROCOCCUS SPECIES   Staphylococcus species (coagulase negative) - MIC*    GENTAMICIN <=0.5 SENSITIVE Sensitive     LEVOFLOXACIN 0.25 SENSITIVE Sensitive     NITROFURANTOIN <=16 SENSITIVE Sensitive     OXACILLIN 0.5 SENSITIVE Sensitive     PENICILLIN <=0.03 SENSITIVE Sensitive     RIFAMPIN <=0.5 SENSITIVE Sensitive     VANCOMYCIN <=0.5 SENSITIVE Sensitive     TETRACYCLINE 2 SENSITIVE Sensitive     * STAPHYLOCOCCUS SPECIES (COAGULASE NEGATIVE)     [x]  Patient discharged originally without antimicrobial agent and treatment is now indicated  New antibiotic prescription: amoxicillin 250mg  po TID x 7 days   ED Provider: Trixie DredgeEmily West, PA-C   Thomas SkinnerFrens, Thomas Morales 06/30/2014, 10:05 AM Infectious Diseases  Pharmacist Phone# [phone]

## 2014-06-30 NOTE — Telephone Encounter (Signed)
Post ED Visit - Positive Culture Follow-up: Successful Patient Follow-Up  Culture assessed and recommendations reviewed by: []  Wes Dulaney, Pharm.D., BCPS [x]  Celedonio MiyamotoJeremy Frens, Pharm.D., BCPS []  Georgina PillionElizabeth Martin, Pharm.D., BCPS []  WinfieldMinh Pham, [place] BCPS, AAHIVP []  Estella HuskMichelle Turner, Pharm.D., BCPS, AAHIVP []  Red ChristiansSamson Lee, Pharm.D. []  Tennis Mustassie Stewart, [place].  Positive urine culture enterococcus  [x]  Patient discharged without antimicrobial prescription and treatment is now indicated []  Organism is resistant to prescribed ED discharge antimicrobial []  Patient with positive blood cultures  Changes discussed with ED provider: Trixie DredgeEmily West PA New antibiotic prescription Amoxil 250mg  po tid x 7 days Called to [facility] [address]  Contacted patient, 06/30/14 1130   Berle MullMiller, Shaka Cardin 06/30/2014, 11:32 AM

## 2014-11-04 ENCOUNTER — Encounter (HOSPITAL_COMMUNITY): Payer: Self-pay | Admitting: Emergency Medicine

## 2014-11-04 ENCOUNTER — Emergency Department (HOSPITAL_COMMUNITY)
Admission: EM | Admit: 2014-11-04 | Discharge: 2014-11-04 | Disposition: A | Discharge status: Home / Self Care | Payer: Medicare Other | Attending: Emergency Medicine | Admitting: Emergency Medicine

## 2014-11-04 DIAGNOSIS — G40909 Epilepsy, unspecified, not intractable, without status epilepticus: Secondary | ICD-10-CM | POA: Diagnosis not present

## 2014-11-04 DIAGNOSIS — N390 Urinary tract infection, site not specified: Secondary | ICD-10-CM | POA: Insufficient documentation

## 2014-11-04 DIAGNOSIS — Z87828 Personal history of other (healed) physical injury and trauma: Secondary | ICD-10-CM | POA: Diagnosis not present

## 2014-11-04 DIAGNOSIS — Z720 Tobacco use: Secondary | ICD-10-CM | POA: Insufficient documentation

## 2014-11-04 DIAGNOSIS — R319 Hematuria, unspecified: Secondary | ICD-10-CM

## 2014-11-04 DIAGNOSIS — Z79899 Other long term (current) drug therapy: Secondary | ICD-10-CM | POA: Insufficient documentation

## 2014-11-04 DIAGNOSIS — Z792 Long term (current) use of antibiotics: Secondary | ICD-10-CM | POA: Diagnosis not present

## 2014-11-04 DIAGNOSIS — R300 Dysuria: Secondary | ICD-10-CM | POA: Diagnosis present

## 2014-11-04 HISTORY — DX: Neuralgia and neuritis, unspecified: M79.2

## 2014-11-04 LAB — URINALYSIS, ROUTINE W REFLEX MICROSCOPIC
BILIRUBIN URINE: NEGATIVE
GLUCOSE, UA: NEGATIVE mg/dL
Ketones, ur: NEGATIVE mg/dL
Nitrite: POSITIVE — AB
PROTEIN: 100 mg/dL — AB
SPECIFIC GRAVITY, URINE: 1.026 (ref 1.005–1.030)
Urobilinogen, UA: 0.2 mg/dL (ref 0.0–1.0)
pH: 6 (ref 5.0–8.0)

## 2014-11-04 LAB — URINE MICROSCOPIC-ADD ON

## 2014-11-04 MED ORDER — STERILE WATER FOR INJECTION IJ SOLN: 1.0000 mL | Freq: Once | INTRAMUSCULAR | Status: DC

## 2014-11-04 MED ORDER — CEFTRIAXONE SODIUM 250 MG IJ SOLR
250.0000 mg | Freq: Once | INTRAMUSCULAR | Status: DC
  Filled 2014-11-04: qty 250

## 2014-11-04 MED ORDER — LIDOCAINE HCL (PF) 1 % IJ SOLN
5.0000 mL | Freq: Once | INTRAMUSCULAR | Status: AC
  Administered 2014-11-04: 2 mL
  Filled 2014-11-04: qty 5

## 2014-11-04 MED ORDER — CIPROFLOXACIN HCL 500 MG PO TABS: 500.0000 mg | ORAL_TABLET | Freq: Two times a day (BID) | ORAL | Status: AC

## 2014-11-04 MED ORDER — LIDOCAINE HCL (PF) 1 % IJ SOLN: 2.0000 mL | Freq: Once | INTRAMUSCULAR | Status: DC

## 2014-11-04 MED ORDER — CEFTRIAXONE SODIUM 1 G IJ SOLR
1.0000 g | Freq: Once | INTRAMUSCULAR | Status: AC
  Administered 2014-11-04: 1 g via INTRAMUSCULAR
  Filled 2014-11-04: qty 10

## 2014-11-04 NOTE — ED Notes (Signed)
Pt has been self cathing since 1995. Pt states he gets recurrent UTIs. Pt feels he has let this infection go too long and is now passing blood clots. Frequent urination, painful urination
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
## 2014-11-04 NOTE — Discharge Instructions (Signed)
Stay very well hydrated with plenty of water throughout the day. Take antibiotic until completed. Please take all of your antibiotics until finished!   You may develop abdominal discomfort or diarrhea from the antibiotic.  You may help offset this with probiotics which you can buy or get in yogurt. Do not eat or take the probiotics until 2 hours after your antibiotic. Follow up with primary care physician in 1 week for recheck of ongoing symptoms but return to ER for emergent changing or worsening of symptoms. Please seek immediate care if you develop the following: You develop back pain.  Your symptoms are no better, or worse in 3 days. There is severe back pain or lower abdominal pain.  You develop chills.  You have a fever.  There is nausea or vomiting.  There is continued burning or discomfort with urination.     Urinary Tract Infection A urinary tract infection (UTI) can occur any place along the urinary tract. The tract includes the kidneys, ureters, bladder, and urethra. A type of germ called bacteria often causes a UTI. UTIs are often helped with antibiotic medicine.  HOME CARE   If given, take antibiotics as told by your doctor. Finish them even if you start to feel better.  Drink enough fluids to keep your pee (urine) clear or pale yellow.  Avoid tea, drinks with caffeine, and bubbly (carbonated) drinks.  Pee often. Avoid holding your pee in for a long time.  Pee before and after having sex (intercourse).  Wipe from front to back after you poop (bowel movement) if you are a woman. Use each tissue only once. GET HELP RIGHT AWAY IF:   You have back pain.  You have lower belly (abdominal) pain.  You have chills.  You feel sick to your stomach (nauseous).  You throw up (vomit).  Your burning or discomfort with peeing does not go away.  You have a fever.  Your symptoms are not better in 3 days. MAKE SURE YOU:   Understand these instructions.  Will watch your  condition.  Will get help right away if you are not doing well or get worse. Document Released: 09/20/2007 Document Revised: 12/27/2011 Document Reviewed: 11/02/2011 [facility] Patient Information 2015 [place], [place]. This information is not intended to replace advice given to you by your health care provider. Make sure you discuss any questions you have with your health care provider.  Dysuria Dysuria is the medical term for pain with urination. There are many causes for dysuria, but urinary tract infection is the most common. If a urinalysis was performed it can show that there is a urinary tract infection. A urine culture confirms that you or your child is sick. You will need to follow up with a healthcare provider because:  If a urine culture was done you will need to know the culture results and treatment recommendations.  If the urine culture was positive, you or your child will need to be put on antibiotics or know if the antibiotics prescribed are the right antibiotics for your urinary tract infection.  If the urine culture is negative (no urinary tract infection), then other causes may need to be explored or antibiotics need to be stopped. Today laboratory work may have been done and there does not seem to be an infection. If cultures were done they will take at least 24 to 48 hours to be completed. Today x-rays may have been taken and they read as normal. No cause can be found for the  problems. The x-rays may be re-read by a radiologist and you will be contacted if additional findings are made. You or your child may have been put on medications to help with this problem until you can see your primary caregiver. If the problems get better, see your primary caregiver if the problems return. If you were given antibiotics (medications which kill germs), take all of the mediations as directed for the full course of treatment.  If laboratory work was done, you need to find the results. Leave a  telephone number where you can be reached. If this is not possible, make sure you find out how you are to get test results. HOME CARE INSTRUCTIONS   Drink lots of fluids. For adults, drink eight, 8 ounce glasses of clear juice or water a day. For children, replace fluids as suggested by your caregiver.  Empty the bladder often. Avoid holding urine for long periods of time.  After a bowel movement, women should cleanse front to back, using each tissue only once.  Empty your bladder before and after sexual intercourse.  Take all the medicine given to you until it is gone. You may feel better in a few days, but TAKE ALL MEDICINE.  Avoid caffeine, tea, alcohol and carbonated beverages, because they tend to irritate the bladder.  In men, alcohol may irritate the prostate.  Only take over-the-counter or prescription medicines for pain, discomfort, or fever as directed by your caregiver.  If your caregiver has given you a follow-up appointment, it is very important to keep that appointment. Not keeping the appointment could result in a chronic or permanent injury, pain, and disability. If there is any problem keeping the appointment, you must call back to this facility for assistance. SEEK IMMEDIATE MEDICAL CARE IF:   Back pain develops.  A fever develops.  There is nausea (feeling sick to your stomach) or vomiting (throwing up).  Problems are no better with medications or are getting worse. MAKE SURE YOU:   Understand these instructions.  Will watch your condition.  Will get help right away if you are not doing well or get worse. Document Released: 12/31/2003 Document Revised: 06/26/2011 Document Reviewed: 11/07/2007 [facility] Patient Information 2015 [place], [place]. This information is not intended to replace advice given to you by your health care provider. Make sure you discuss any questions you have with your health care provider.  Antibiotic Medication Antibiotic medicine helps  fight germs. Germs cause infections. This type of medicine will not work for colds, flu, or other viral infections. Tell your doctor if you:  Are allergic to any medicines.  Are pregnant or are trying to get pregnant.  Are taking other medicines.  Have other medical problems. HOME CARE  Take your medicine with a glass of water or food as told by your doctor.  Take the medicine as told. Finish them even if you start to feel better.  Do not give your medicine to other people.  Do not use your medicine in the future for a different infection.  Ask your doctor about which side effects to watch for.  Try not to miss any doses. If you miss a dose, take it as soon as possible. If it is almost time for your next dose, and your dosing schedule is:  Two doses a day, take the missed dose and the next dose 5 to 6 hours later.  Three or more doses a day, take the missed dose and the next dose 2 to 4 hours  later, or double your next dose.  Then go back to your normal schedule. GET HELP RIGHT AWAY IF:   You get worse or do not get better within a few days.  The medicine makes you sick.  You develop a rash or any other side effects.  You have questions or concerns. MAKE SURE YOU:  Understand these instructions.  Will watch your condition.  Will get help right away if you are not doing well or get worse. Document Released: 01/11/2008 Document Revised: 06/26/2011 Document Reviewed: 03/09/2009 [facility] Patient Information 2015 [place], [place]. This information is not intended to replace advice given to you by your health care provider. Make sure you discuss any questions you have with your health care provider.  Catheter-Associated Urinary Tract Infection FAQs WHAT IS "CATHETER-ASSOCIATED" URINARY TRACT INFECTION? A urinary tract infection (also called "UTI") is an infection in the urinary system, which includes the bladder (which stores the urine) and the kidneys (which filter the  blood to make urine). Germs (for example, bacteria or yeasts) do not normally live in these areas; but if germs are introduced, an infection can occur. If you have a urinary catheter, germs can travel along the catheter and cause an infection in your bladder or your kidney; in that case it is called a catheter-associated urinary tract infection (or "CA-UTI").  WHAT IS A URINARY CATHETER? A urinary catheter is a thin tube placed in the bladder to drain urine. Urine drains through the tube into a bag that collects the urine. A urinary  catheter may be used:  If you are not able to urinate on your own.  To measure the amount of urine that you make, for example, during intensive care.  During and after some types of surgery.  During some tests of the kidneys and bladder . People with urinary catheters have a much higher chance of getting a urinary tract infection than people who don't have a catheter. HOW DO I GET A CATHETER-ASSOCIATED URINARY TRACT INFECTION (CA-UTI)? If germs enter the urinary tract, they may cause an infection. Many of the germs that cause a catheter-associated urinary tract infection are common germs found in your intestines that do not usually cause an infection there. Germs can enter the urinary tract when the catheter is being put in or while the catheter remains in the bladder.  WHAT ARE THE SYMPTOMS OF A URINARY TRACT INFECTION?  Some of the common symptoms of a urinary tract infection are:  Burning or pain in the lower abdomen (that is, below the stomach).  Fever.  Bloody urine may be a sign of infection, but is also caused by other problems .  Burning during urination or an increase in the frequency of urination after the catheter is removed. Sometimes people with catheter-associated urinary tract infections do not have these symptoms of infection. CAN CATHETER-ASSOCIATED URINARY TRACT INFECTIONS BE TREATED? Yes, most catheter-associated urinary tract infections can  be treated with antibiotics and removal or change of the catheter. Your doctor will determine which antibiotic is best for you.  WHAT ARE SOME OF THE THINGS THAT HOSPITALS ARE DOING TO PREVENT CATHETER-ASSOCIATED URINARY TRACT INFECTIONS? To prevent urinary tract infections, doctors and nurses take the following actions.  Catheter insertion  Catheters are put in only when necessary and they are removed as soon as possible.  Only properly trained persons insert catheters using sterile ("clean") technique.  The skin in the area where the catheter will be inserted is cleaned before inserting the catheter.  Other methods to drain the urine are sometimes used, such as:  External catheters in men (these look like condoms and are placed over the penis rather than into the penis)  Putting a temporary catheter in to drain the urine and removing it right away. This is called intermittent urethral catheterization. Catheter care  Healthcare providers clean their hands by washing them with soap and water or using an alcohol-based hand rub before and after touching your catheter.  If you do not see your providers clean their hands, please ask them to do so.  Avoid disconnecting the catheter and drain tube. This helps to prevent germs from getting into the catheter tube.  The catheter is secured to the leg to prevent pulling on the catheter.  Avoid twisting or kinking the catheter.  Keep the bag lower than the bladder to prevent urine from backflowing to the bladder.  Empty the bag regularly. The drainage spout should not touch anything while emptying the bag. WHAT CAN I DO TO HELP PREVENT CATHETER-ASSOCIATED URINARY TRACT INFECTIONS IF I HAVE A CATHETER?  Always clean your hands before and after doing catheter care.  Always keep your urine bag below the level of your bladder.  Do not tug or pull on the tubing.  Do not twist or kink the catheter tubing.  Ask your healthcare provider each  day if you still need the catheter. WHAT DO I NEED TO DO WHEN I GO HOME FROM THE HOSPITAL?  If you will be going home with a catheter, your doctor or nurse should explain everything you need to know about taking care of the catheter. Make sure you understand how to care for it before you leave the hospital.  If you develop any of the symptoms of a urinary tract infection, such as burning or pain in the lower abdomen, fever, or an increase in the frequency of urination, contact your doctor or nurse immediately.  Before you go home, make sure you know who to contact if you have questions or problems after you get home. If you have questions, please ask your doctor or nurse. Developed and co-sponsored by [employer] for [employer] [place] [phone]); Infectious Diseases Society of America (IDSA); [facility] Association; Association for Professionals in Infection Control and Epidemiology (APIC); Center for Disease Control (CDC); and The Joint Commission Document Released: 12/27/2011 Document Reviewed: 12/27/2011 [facility] Patient Information 2015 [place], [place]. This information is not intended to replace advice given to you by your health care provider. Make sure you discuss any questions you have with your health care provider.

## 2014-11-04 NOTE — ED Notes (Signed)
No reaction noted to IM medication. Pt tolerated well, ready for D/C
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
## 2014-11-05 LAB — URINE CULTURE

## 2014-11-14 ENCOUNTER — Encounter (HOSPITAL_COMMUNITY): Payer: Self-pay | Admitting: Emergency Medicine

## 2014-11-14 ENCOUNTER — Emergency Department (HOSPITAL_COMMUNITY)
Admission: EM | Admit: 2014-11-14 | Discharge: 2014-11-14 | Disposition: A | Discharge status: Home / Self Care | Payer: Medicare Other | Attending: Emergency Medicine | Admitting: Emergency Medicine

## 2014-11-14 DIAGNOSIS — Z720 Tobacco use: Secondary | ICD-10-CM | POA: Insufficient documentation

## 2014-11-14 DIAGNOSIS — Z792 Long term (current) use of antibiotics: Secondary | ICD-10-CM | POA: Insufficient documentation

## 2014-11-14 DIAGNOSIS — Z79899 Other long term (current) drug therapy: Secondary | ICD-10-CM | POA: Diagnosis not present

## 2014-11-14 DIAGNOSIS — Z7982 Long term (current) use of aspirin: Secondary | ICD-10-CM | POA: Insufficient documentation

## 2014-11-14 DIAGNOSIS — G40909 Epilepsy, unspecified, not intractable, without status epilepticus: Secondary | ICD-10-CM | POA: Diagnosis not present

## 2014-11-14 DIAGNOSIS — M79672 Pain in left foot: Secondary | ICD-10-CM | POA: Diagnosis present

## 2014-11-14 DIAGNOSIS — G629 Polyneuropathy, unspecified: Secondary | ICD-10-CM | POA: Diagnosis not present

## 2014-11-14 MED ORDER — PREGABALIN 100 MG PO CAPS: 100.0000 mg | ORAL_CAPSULE | Freq: Two times a day (BID) | ORAL | Status: DC
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
## 2014-11-14 NOTE — ED Notes (Signed)
Pt. reports chronic left foot pain for several years worse this past several days after he ran out of his medication Lyrica , denies injury /ambulatory .

## 2014-11-14 NOTE — Discharge Instructions (Signed)
Please monitor for new or worsening signs or symptoms, return immediately if any present. Please contact her primary care provider and schedule follow-up evaluation.

## 2014-12-26 ENCOUNTER — Emergency Department (HOSPITAL_COMMUNITY)
Admission: EM | Admit: 2014-12-26 | Discharge: 2014-12-26 | Disposition: A | Discharge status: Home / Self Care | Payer: Medicare Other | Attending: Emergency Medicine | Admitting: Emergency Medicine

## 2014-12-26 ENCOUNTER — Encounter (HOSPITAL_COMMUNITY): Payer: Self-pay | Admitting: *Deleted

## 2014-12-26 DIAGNOSIS — Z79899 Other long term (current) drug therapy: Secondary | ICD-10-CM | POA: Insufficient documentation

## 2014-12-26 DIAGNOSIS — R300 Dysuria: Secondary | ICD-10-CM | POA: Diagnosis present

## 2014-12-26 DIAGNOSIS — Z720 Tobacco use: Secondary | ICD-10-CM | POA: Diagnosis not present

## 2014-12-26 DIAGNOSIS — N390 Urinary tract infection, site not specified: Secondary | ICD-10-CM

## 2014-12-26 DIAGNOSIS — G40909 Epilepsy, unspecified, not intractable, without status epilepticus: Secondary | ICD-10-CM | POA: Diagnosis not present

## 2014-12-26 LAB — URINE MICROSCOPIC-ADD ON

## 2014-12-26 LAB — URINALYSIS, ROUTINE W REFLEX MICROSCOPIC
Bilirubin Urine: NEGATIVE
GLUCOSE, UA: NEGATIVE mg/dL
Ketones, ur: 15 mg/dL — AB
Nitrite: POSITIVE — AB
Protein, ur: 30 mg/dL — AB
SPECIFIC GRAVITY, URINE: 1.023 (ref 1.005–1.030)
Urobilinogen, UA: 0.2 mg/dL (ref 0.0–1.0)
pH: 5.5 (ref 5.0–8.0)

## 2014-12-26 MED ORDER — PREGABALIN 100 MG PO CAPS: 100.0000 mg | ORAL_CAPSULE | Freq: Two times a day (BID) | ORAL | Status: AC

## 2014-12-26 MED ORDER — CIPROFLOXACIN HCL 500 MG PO TABS: 500.0000 mg | ORAL_TABLET | Freq: Two times a day (BID) | ORAL | Status: AC

## 2014-12-26 MED ORDER — CEFTRIAXONE SODIUM 1 G IJ SOLR
1.0000 g | Freq: Once | INTRAMUSCULAR | Status: AC
  Administered 2014-12-26: 1 g via INTRAVENOUS
  Filled 2014-12-26: qty 10

## 2014-12-26 MED ORDER — CIPROFLOXACIN HCL 500 MG PO TABS
500.0000 mg | ORAL_TABLET | Freq: Once | ORAL | Status: AC
  Administered 2014-12-26: 500 mg via ORAL
  Filled 2014-12-26: qty 1
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
## 2014-12-26 NOTE — Discharge Instructions (Signed)
Catheter-Associated Urinary Tract Infection FAQs °WHAT IS "CATHETER-ASSOCIATED" URINARY TRACT INFECTION? °A urinary tract infection (also called "UTI") is an infection in the urinary system, which includes the bladder (which stores the urine) and the kidneys (which filter the blood to make urine). Germs (for example, bacteria or yeasts) do not normally live in these areas; but if germs are introduced, an infection can occur. If you have a urinary catheter, germs can travel along the catheter and cause an infection in your bladder or your kidney; in that case it is called a catheter-associated urinary tract infection (or "CA-UTI").  °WHAT IS A URINARY CATHETER? °A urinary catheter is a thin tube placed in the bladder to drain urine. Urine drains through the tube into a bag that collects the urine. A urinary  °catheter may be used: °· If you are not able to urinate on your own. °· To measure the amount of urine that you make, for example, during intensive care. °· During and after some types of surgery. °· During some tests of the kidneys and bladder . °People with urinary catheters have a much higher chance of getting a urinary tract infection than people who don't have a catheter. °HOW DO I GET A CATHETER-ASSOCIATED URINARY TRACT INFECTION (CA-UTI)? °If germs enter the urinary tract, they may cause an infection. Many of the germs that cause a catheter-associated urinary tract infection are common germs found in your intestines that do not usually cause an infection there. Germs can enter the urinary tract when the catheter is being put in or while the catheter remains in the bladder.  °WHAT ARE THE SYMPTOMS OF A URINARY TRACT INFECTION?  °Some of the common symptoms of a urinary tract infection are: °· Burning or pain in the lower abdomen (that is, below the stomach). °· Fever. °· Bloody urine may be a sign of infection, but is also caused by other problems . °· Burning during urination or an increase in the  frequency of urination after the catheter is removed. °Sometimes people with catheter-associated urinary tract infections do not have these symptoms of infection. °CAN CATHETER-ASSOCIATED URINARY TRACT INFECTIONS BE TREATED? °Yes, most catheter-associated urinary tract infections can be treated with antibiotics and removal or change of the catheter. Your doctor will determine which antibiotic is best for you.  °WHAT ARE SOME OF THE THINGS THAT HOSPITALS ARE DOING TO PREVENT CATHETER-ASSOCIATED URINARY TRACT INFECTIONS? °To prevent urinary tract infections, doctors and nurses take the following actions.  °Catheter insertion °· Catheters are put in only when necessary and they are removed as soon as possible. °· Only properly trained persons insert catheters using sterile ("clean") technique. °· The skin in the area where the catheter will be inserted is cleaned before inserting the catheter. °· Other methods to drain the urine are sometimes used, such as: °¨ External catheters in men (these look like condoms and are placed over the penis rather than into the penis) °¨ Putting a temporary catheter in to drain the urine and removing it right away. This is called intermittent urethral catheterization. °Catheter care °· Healthcare providers clean their hands by washing them with soap and water or using an alcohol-based hand rub before and after touching your catheter. °¨ If you do not see your providers clean their hands, please ask them to do so. °· Avoid disconnecting the catheter and drain tube. This helps to prevent germs from getting into the catheter tube. °· The catheter is secured to the leg to prevent pulling on the   catheter. °· Avoid twisting or kinking the catheter. °· Keep the bag lower than the bladder to prevent urine from backflowing to the bladder. °· Empty the bag regularly. The drainage spout should not touch anything while emptying the bag. °WHAT CAN I DO TO HELP PREVENT CATHETER-ASSOCIATED URINARY  TRACT INFECTIONS IF I HAVE A CATHETER? °· Always clean your hands before and after doing catheter care. °· Always keep your urine bag below the level of your bladder. °· Do not tug or pull on the tubing. °· Do not twist or kink the catheter tubing. °· Ask your healthcare provider each day if you still need the catheter. °WHAT DO I NEED TO DO WHEN I GO HOME FROM THE HOSPITAL? °· If you will be going home with a catheter, your doctor or nurse should explain everything you need to know about taking care of the catheter. Make sure you understand how to care for it before you leave the hospital. °· If you develop any of the symptoms of a urinary tract infection, such as burning or pain in the lower abdomen, fever, or an increase in the frequency of urination, contact your doctor or nurse immediately. °· Before you go home, make sure you know who to contact if you have questions or problems after you get home. °If you have questions, please ask your doctor or nurse. °Developed and co-sponsored by The Society for Healthcare Epidemiology of America (SHEA); Infectious Diseases Society of America (IDSA); The American Hospital Association; Association for Professionals in Infection Control and Epidemiology (APIC); Center for Disease Control (CDC); and The Joint Commission °Document Released: 12/27/2011 Document Reviewed: 12/27/2011 °Exit[employer]® Patient Information ©2015 [employer]. This information is not intended to replace advice given to you by your health care provider. Make sure you discuss any questions you have with your health care provider. ° °

## 2014-12-26 NOTE — ED Notes (Signed)
The pt reports he has a uti.  He has had [painful urination and he has seen some blood in his urine.  He also wants a rx for lyrica for nerve pain in his feet

## 2014-12-29 LAB — URINE CULTURE: Culture: >=100000

## 2015-04-13 ENCOUNTER — Encounter (HOSPITAL_COMMUNITY): Payer: Self-pay | Admitting: *Deleted

## 2015-04-13 ENCOUNTER — Emergency Department (HOSPITAL_COMMUNITY)
Admission: EM | Admit: 2015-04-13 | Discharge: 2015-04-13 | Disposition: A | Discharge status: Home / Self Care | Payer: Medicare Other | Attending: Emergency Medicine | Admitting: Emergency Medicine

## 2015-04-13 DIAGNOSIS — G40909 Epilepsy, unspecified, not intractable, without status epilepticus: Secondary | ICD-10-CM | POA: Insufficient documentation

## 2015-04-13 DIAGNOSIS — Z8669 Personal history of other diseases of the nervous system and sense organs: Secondary | ICD-10-CM | POA: Diagnosis not present

## 2015-04-13 DIAGNOSIS — Z792 Long term (current) use of antibiotics: Secondary | ICD-10-CM | POA: Diagnosis not present

## 2015-04-13 DIAGNOSIS — N390 Urinary tract infection, site not specified: Secondary | ICD-10-CM | POA: Diagnosis not present

## 2015-04-13 DIAGNOSIS — Z79899 Other long term (current) drug therapy: Secondary | ICD-10-CM | POA: Diagnosis not present

## 2015-04-13 DIAGNOSIS — Z8739 Personal history of other diseases of the musculoskeletal system and connective tissue: Secondary | ICD-10-CM | POA: Insufficient documentation

## 2015-04-13 DIAGNOSIS — R350 Frequency of micturition: Secondary | ICD-10-CM | POA: Diagnosis present

## 2015-04-13 DIAGNOSIS — Z87828 Personal history of other (healed) physical injury and trauma: Secondary | ICD-10-CM | POA: Insufficient documentation

## 2015-04-13 DIAGNOSIS — F17210 Nicotine dependence, cigarettes, uncomplicated: Secondary | ICD-10-CM | POA: Diagnosis not present

## 2015-04-13 LAB — URINALYSIS, ROUTINE W REFLEX MICROSCOPIC
BILIRUBIN URINE: NEGATIVE
Glucose, UA: NEGATIVE mg/dL
KETONES UR: NEGATIVE mg/dL
NITRITE: POSITIVE — AB
PH: 5 (ref 5.0–8.0)
PROTEIN: NEGATIVE mg/dL
Specific Gravity, Urine: 1.013 (ref 1.005–1.030)

## 2015-04-13 LAB — URINE MICROSCOPIC-ADD ON

## 2015-04-13 LAB — CBG MONITORING, ED: Glucose-Capillary: 89 mg/dL (ref 65–99)

## 2015-04-13 MED ORDER — HYDROCODONE-ACETAMINOPHEN 5-325 MG PO TABS: 1.0000 | ORAL_TABLET | Freq: Four times a day (QID) | ORAL | Status: AC | PRN

## 2015-04-13 MED ORDER — CIPROFLOXACIN HCL 500 MG PO TABS: 500.0000 mg | ORAL_TABLET | Freq: Two times a day (BID) | ORAL | Status: AC

## 2015-04-13 MED ORDER — CIPROFLOXACIN HCL 500 MG PO TABS
500.0000 mg | ORAL_TABLET | Freq: Two times a day (BID) | ORAL | Status: DC
  Administered 2015-04-13: 500 mg via ORAL
  Filled 2015-04-13: qty 1

## 2015-04-13 MED ORDER — HYDROCODONE-ACETAMINOPHEN 5-325 MG PO TABS
2.0000 | ORAL_TABLET | Freq: Once | ORAL | Status: AC
  Administered 2015-04-13: 2 via ORAL
  Filled 2015-04-13: qty 2

## 2015-04-13 NOTE — ED Notes (Signed)
States he has to self cath everyday c/o freq. Pain and burning with urination onset several weeks ago. States he currently doesn't have a Librarian, academicDoctor

## 2015-04-13 NOTE — ED Notes (Signed)
Pt home stable after verbalizes understanding of instructions.
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
## 2015-04-13 NOTE — ED Notes (Signed)
Given drink to help get voided sample

## 2015-04-13 NOTE — Discharge Instructions (Signed)
It was our pleasure to provide your ER care today - we hope that you feel better.  Drink plenty of fluids.  Take motrin or aleve as need for pain. For break through pain, you may also take hydrocodone as need for pain. No driving when taking hydrocodone. Also, do not take tylenol or acetaminophen containing medication when taking hydrocodone.  Take cipro (antibiotic) as prescribed.  Follow up with primary care doctor in the next couple days if symptoms fail to improve/resolve.  Also note that any additional prescription for pain medication (for chronic pain issues) will need to come from your primary care doctor.   Return to ER if worse, new symptoms, fevers, persistent vomiting, other concern.   You were given pain medication in the ER - no driving for the next 4 hours.       Urinary Tract Infection Urinary tract infections (UTIs) can develop anywhere along your urinary tract. Your urinary tract is your body's drainage system for removing wastes and extra water. Your urinary tract includes two kidneys, two ureters, a bladder, and a urethra. Your kidneys are a pair of bean-shaped organs. Each kidney is about the size of your fist. They are located below your ribs, one on each side of your spine. CAUSES Infections are caused by microbes, which are microscopic organisms, including fungi, viruses, and bacteria. These organisms are so small that they can only be seen through a microscope. Bacteria are the microbes that most commonly cause UTIs. SYMPTOMS  Symptoms of UTIs may vary by age and gender of the patient and by the location of the infection. Symptoms in young women typically include a frequent and intense urge to urinate and a painful, burning feeling in the bladder or urethra during urination. Older women and men are more likely to be tired, shaky, and weak and have muscle aches and abdominal pain. A fever may mean the infection is in your kidneys. Other symptoms of a kidney infection  include pain in your back or sides below the ribs, nausea, and vomiting. DIAGNOSIS To diagnose a UTI, your caregiver will ask you about your symptoms. Your caregiver will also ask you to provide a urine sample. The urine sample will be tested for bacteria and white blood cells. White blood cells are made by your body to help fight infection. TREATMENT  Typically, UTIs can be treated with medication. Because most UTIs are caused by a bacterial infection, they usually can be treated with the use of antibiotics. The choice of antibiotic and length of treatment depend on your symptoms and the type of bacteria causing your infection. HOME CARE INSTRUCTIONS  If you were prescribed antibiotics, take them exactly as your caregiver instructs you. Finish the medication even if you feel better after you have only taken some of the medication.  Drink enough water and fluids to keep your urine clear or pale yellow.  Avoid caffeine, tea, and carbonated beverages. They tend to irritate your bladder.  Empty your bladder often. Avoid holding urine for long periods of time.  Empty your bladder before and after sexual intercourse.  After a bowel movement, women should cleanse from front to back. Use each tissue only once. SEEK MEDICAL CARE IF:   You have back pain.  You develop a fever.  Your symptoms do not begin to resolve within 3 days. SEEK IMMEDIATE MEDICAL CARE IF:   You have severe back pain or lower abdominal pain.  You develop chills.  You have nausea or vomiting.  You  have continued burning or discomfort with urination. MAKE SURE YOU:   Understand these instructions.  Will watch your condition.  Will get help right away if you are not doing well or get worse.   This information is not intended to replace advice given to you by your health care provider. Make sure you discuss any questions you have with your health care provider.   Document Released: 01/11/2005 Document Revised:  12/23/2014 Document Reviewed: 05/12/2011 Elsevier Interactive Patient Education [employer]

## 2015-04-14 LAB — URINE CULTURE

## 2015-08-27 IMAGING — CR DG FOOT COMPLETE 3+V*L*
3 series · 3 of 3 positions shown · non-contrast
Comparison: 05/20/2012

CLINICAL DATA: Left foot pain, small abrasion and puncture wound on
anterior 4th DIP joint. Pt states he tried to cut his toe off with a
hammer and chisel x 2 nights ago.

EXAM:
LEFT FOOT - COMPLETE 3+ VIEW

[x foot ap left]
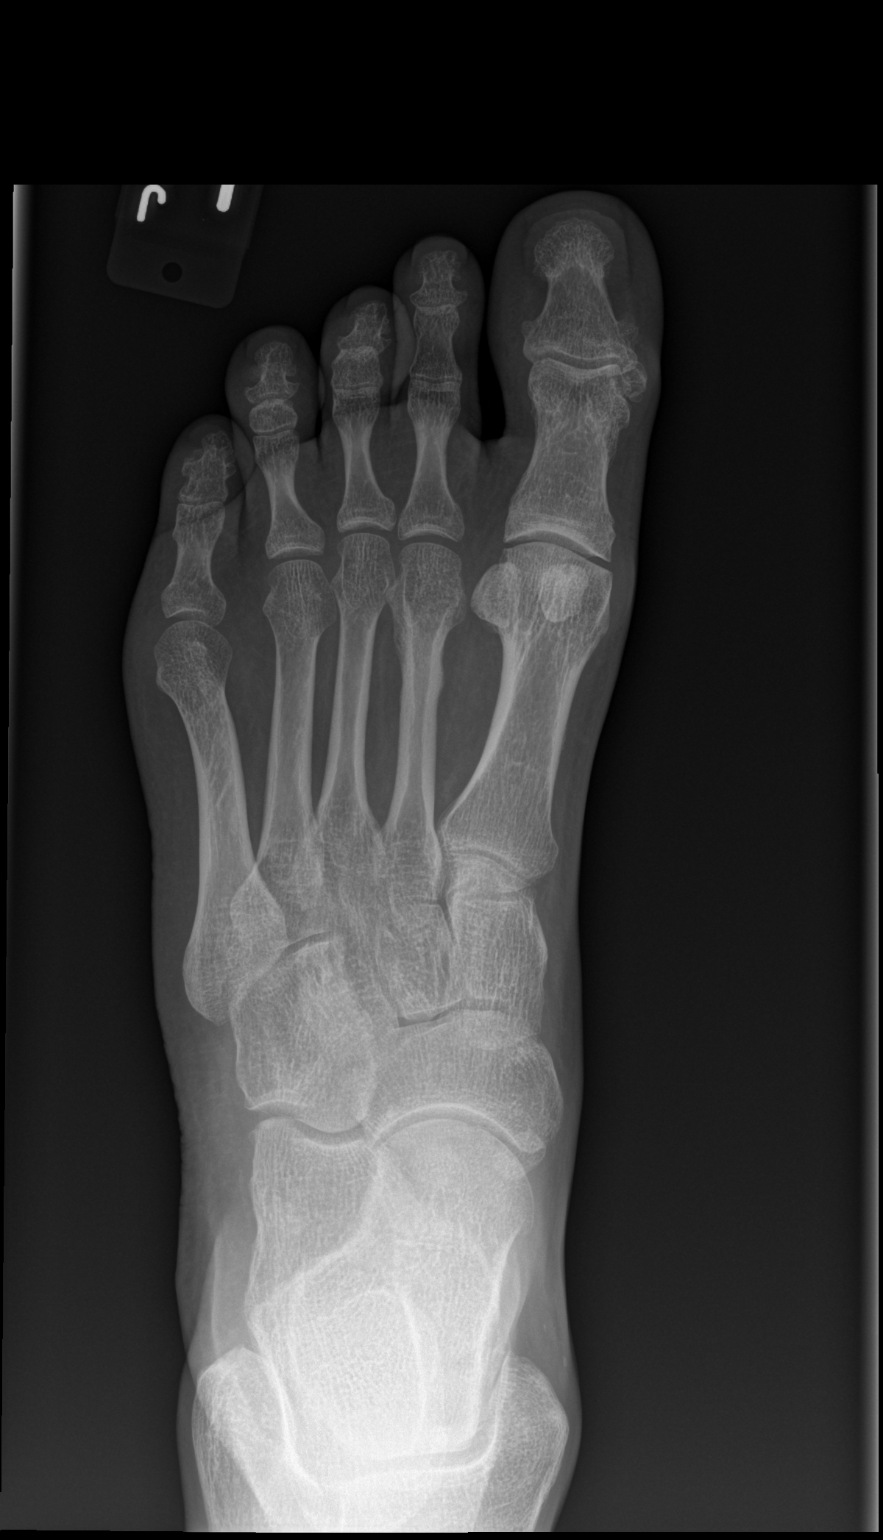

[x foot obl left]
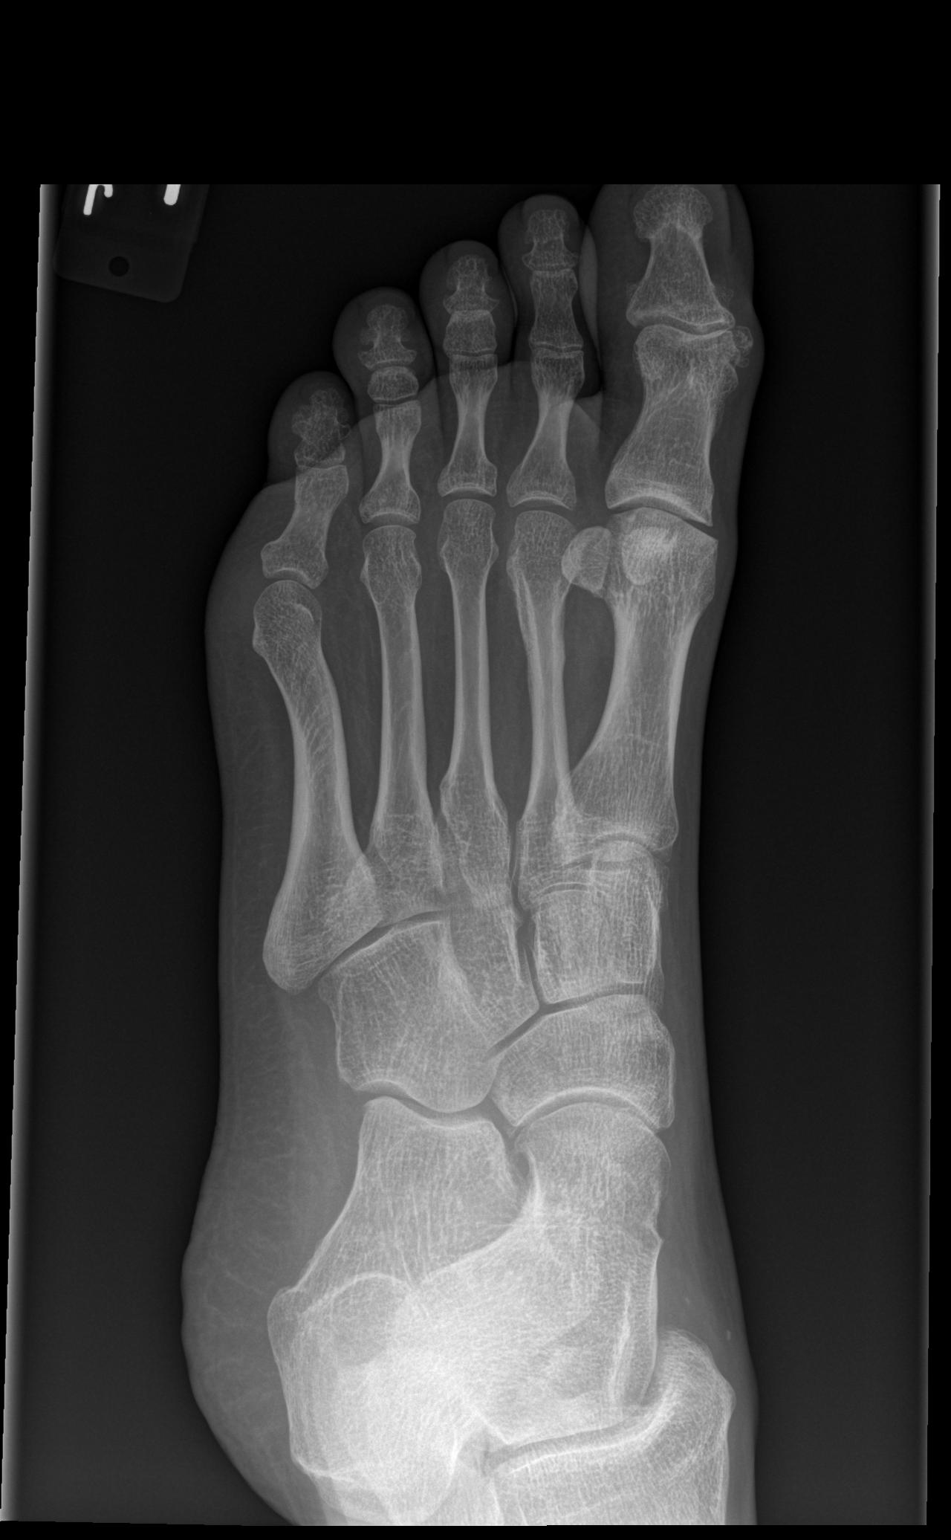

[x foot lat left]
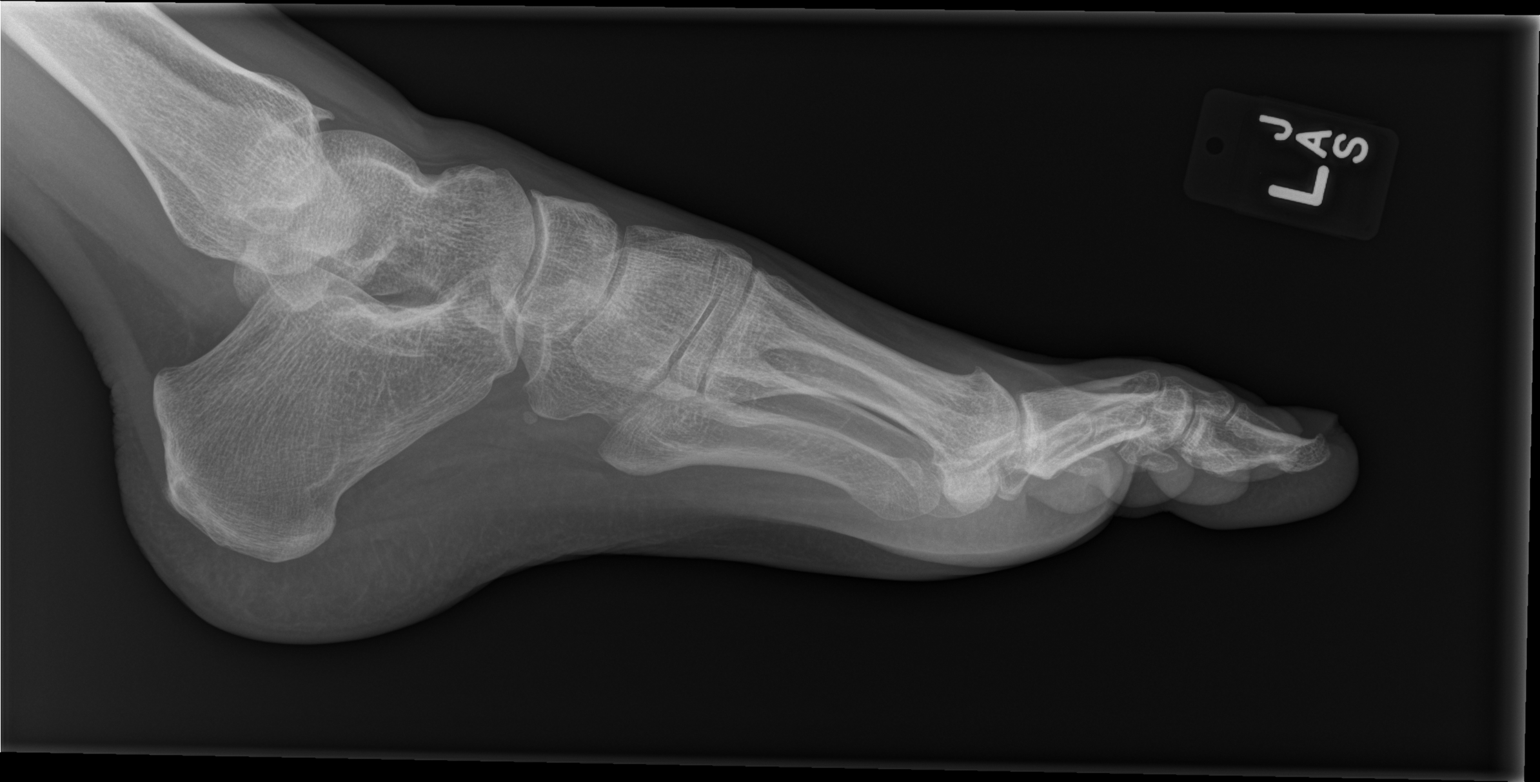

[3 of 3 positions shown; findings below may reference images not displayed]

FINDINGS: There is no evidence of fracture or dislocation. There is no
evidence of arthropathy or other focal bone abnormality. Soft
tissues are unremarkable. No radiodense foreign body or subcutaneous
gas.
IMPRESSION: Negative.

## 2016-05-12 ENCOUNTER — Encounter (HOSPITAL_COMMUNITY): Payer: Self-pay | Admitting: *Deleted

## 2016-05-12 ENCOUNTER — Emergency Department (HOSPITAL_COMMUNITY)
Admission: EM | Admit: 2016-05-12 | Discharge: 2016-05-12 | Disposition: A | Discharge status: Home / Self Care | Payer: Medicare Other | Attending: Emergency Medicine | Admitting: Emergency Medicine

## 2016-05-12 DIAGNOSIS — Z79899 Other long term (current) drug therapy: Secondary | ICD-10-CM | POA: Insufficient documentation

## 2016-05-12 DIAGNOSIS — Z760 Encounter for issue of repeat prescription: Secondary | ICD-10-CM | POA: Diagnosis not present

## 2016-05-12 DIAGNOSIS — F17210 Nicotine dependence, cigarettes, uncomplicated: Secondary | ICD-10-CM | POA: Diagnosis not present

## 2016-05-12 MED ORDER — LEVETIRACETAM 500 MG PO TABS: 500.0000 mg | ORAL_TABLET | Freq: Two times a day (BID) | ORAL | 0 refills | Status: AC

## 2016-05-12 MED ORDER — LEVETIRACETAM 500 MG PO TABS: 500.0000 mg | ORAL_TABLET | Freq: Two times a day (BID) | ORAL | 0 refills | Status: DC

## 2016-05-12 MED ORDER — GABAPENTIN 800 MG PO TABS: 800.0000 mg | ORAL_TABLET | Freq: Three times a day (TID) | ORAL | 0 refills | Status: AC

## 2016-05-12 MED ORDER — GABAPENTIN 800 MG PO TABS: 800.0000 mg | ORAL_TABLET | Freq: Three times a day (TID) | ORAL | 0 refills | Status: DC

## 2016-05-12 NOTE — Discharge Instructions (Signed)
Keep your scheduled appointment with your primary care provider on February 5th.  Return to ER for new or worsening symptoms, any additional concerns.

## 2016-05-12 NOTE — ED Triage Notes (Signed)
Pt reports having appt with pcp but not until 2/5. Pt needs refills on keppra and gabapentin to last until his appt. Has missed keppra x 1 dose. No other complaints and no acute distress is noted at triage.
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
## 2017-01-28 ENCOUNTER — Emergency Department (HOSPITAL_COMMUNITY)
Admission: EM | Admit: 2017-01-28 | Discharge: 2017-01-28 | Disposition: A | Discharge status: Home / Self Care | Payer: Medicare Other | Attending: Emergency Medicine | Admitting: Emergency Medicine

## 2017-01-28 ENCOUNTER — Encounter (HOSPITAL_COMMUNITY): Payer: Self-pay | Admitting: Emergency Medicine

## 2017-01-28 DIAGNOSIS — Z760 Encounter for issue of repeat prescription: Secondary | ICD-10-CM | POA: Diagnosis present

## 2017-01-28 DIAGNOSIS — F17210 Nicotine dependence, cigarettes, uncomplicated: Secondary | ICD-10-CM | POA: Diagnosis not present

## 2017-01-28 DIAGNOSIS — Z79899 Other long term (current) drug therapy: Secondary | ICD-10-CM | POA: Diagnosis not present

## 2017-01-28 NOTE — ED Triage Notes (Signed)
Pt. Stated, I have ran out of my catheters for in and out cath. They will not send another shipman for 2 more weeks.

## 2017-01-28 NOTE — ED Notes (Signed)
Declined W/C at D/C and was escorted to lobby by RN. 
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
## 2017-05-05 ENCOUNTER — Other Ambulatory Visit: Payer: Self-pay

## 2018-01-01 MED FILL — SUBOXONE 8 MG-2 MG SL FILM: 8-2 | 28 days supply | Qty: 70 | Fill #0

## 2018-01-29 MED FILL — SUBOXONE 8 MG-2 MG SL FILM: 8-2 | 28 days supply | Qty: 70 | Fill #0

## 2018-02-26 MED FILL — SUBOXONE 8 MG-2 MG SL FILM: 8-2 | 28 days supply | Qty: 70 | Fill #0

## 2018-03-26 MED FILL — SUBOXONE 8 MG-2 MG SL FILM: 8-2 | 28 days supply | Qty: 70 | Fill #0

## 2018-04-23 MED FILL — SUBOXONE 8 MG-2 MG SL FILM: 8-2 | 28 days supply | Qty: 70 | Fill #0

## 2018-05-21 MED FILL — SUBOXONE 8 MG-2 MG SL FILM: 8-2 | 28 days supply | Qty: 70 | Fill #0

## 2018-06-18 MED FILL — SUBOXONE 8 MG-2 MG SL FILM: 8-2 | 28 days supply | Qty: 70 | Fill #0

## 2018-07-16 MED FILL — SUBOXONE 8 MG-2 MG SL FILM: 8-2 | 28 days supply | Qty: 70 | Fill #0

## 2018-08-13 MED FILL — SUBOXONE 8 MG-2 MG SL FILM: 8-2 | 28 days supply | Qty: 70 | Fill #0

## 2018-09-10 MED FILL — SUBOXONE 8 MG-2 MG SL FILM: 8-2 | 28 days supply | Qty: 70 | Fill #0

## 2018-10-08 MED FILL — SUBOXONE 8 MG-2 MG SL FILM: 8-2 | 28 days supply | Qty: 70 | Fill #0

## 2018-11-05 MED FILL — SUBOXONE 8 MG-2 MG SL FILM: 8-2 | 28 days supply | Qty: 70 | Fill #0

## 2018-12-03 MED FILL — SUBOXONE 8 MG-2 MG SL FILM: 8-2 | 28 days supply | Qty: 70 | Fill #0

## 2018-12-31 MED FILL — SUBOXONE 8 MG-2 MG SL FILM: 8-2 | 28 days supply | Qty: 70 | Fill #0

## 2019-01-28 MED FILL — SUBOXONE 8 MG-2 MG SL FILM: 8-2 | 28 days supply | Qty: 70 | Fill #0

## 2019-02-25 MED FILL — SUBOXONE 8 MG-2 MG SL FILM: 8-2 | 28 days supply | Qty: 70 | Fill #0

## 2019-03-25 MED FILL — SUBOXONE 8 MG-2 MG SL FILM: 8-2 | 28 days supply | Qty: 70 | Fill #0

## 2019-04-22 MED FILL — SUBOXONE 8 MG-2 MG SL FILM: 8-2 | 28 days supply | Qty: 70 | Fill #0

## 2019-05-20 MED FILL — SUBOXONE 8 MG-2 MG SL FILM: 8-2 | 28 days supply | Qty: 70 | Fill #0

## 2019-06-17 MED FILL — SUBOXONE 8 MG-2 MG SL FILM: 8-2 | 28 days supply | Qty: 70 | Fill #0

## 2019-07-15 MED FILL — SUBOXONE 8 MG-2 MG SL FILM: 8-2 | 28 days supply | Qty: 70 | Fill #0

## 2019-08-12 MED FILL — SUBOXONE 8 MG-2 MG SL FILM: 8-2 | 28 days supply | Qty: 70 | Fill #0

## 2019-09-09 MED FILL — SUBOXONE 8 MG-2 MG SL FILM: 8-2 | 28 days supply | Qty: 70 | Fill #0

## 2019-10-07 MED FILL — SUBOXONE 8 MG-2 MG SL FILM: 8-2 | 28 days supply | Qty: 70 | Fill #0

## 2019-11-04 MED FILL — SUBOXONE 8 MG-2 MG SL FILM: 8-2 | 28 days supply | Qty: 70 | Fill #0

## 2019-12-02 MED FILL — SUBOXONE 8 MG-2 MG SL FILM: 8-2 | 28 days supply | Qty: 70 | Fill #0

## 2019-12-02 MED FILL — NARCAN 4 MG NASAL SPRAY: 4 | 1 days supply | Qty: 2 | Fill #0

## 2019-12-30 MED FILL — SUBOXONE 8 MG-2 MG SL FILM: 8-2 | 28 days supply | Qty: 70 | Fill #0

## 2020-01-27 ENCOUNTER — Other Ambulatory Visit (HOSPITAL_COMMUNITY): Payer: Self-pay | Admitting: Anesthesiology

## 2020-01-27 DIAGNOSIS — R69 Illness, unspecified: Secondary | ICD-10-CM | POA: Diagnosis not present

## 2020-01-27 MED FILL — SUBOXONE 8 MG-2 MG SL FILM: 8-2 | 28 days supply | Qty: 70 | Fill #0

## 2020-02-24 ENCOUNTER — Other Ambulatory Visit (HOSPITAL_COMMUNITY): Payer: Self-pay | Admitting: Anesthesiology

## 2020-02-24 DIAGNOSIS — R69 Illness, unspecified: Secondary | ICD-10-CM | POA: Diagnosis not present

## 2020-02-26 MED FILL — BUPRENORPHINE HCL-NALOXONE: 8-2 | 28 days supply | Qty: 70 | Fill #0

## 2020-03-23 ENCOUNTER — Other Ambulatory Visit (HOSPITAL_COMMUNITY): Payer: Self-pay | Admitting: Anesthesiology

## 2020-03-23 DIAGNOSIS — R69 Illness, unspecified: Secondary | ICD-10-CM | POA: Diagnosis not present

## 2020-03-23 MED FILL — BUPRENORPHINE HCL-NALOXONE: 8-2 | 28 days supply | Qty: 70 | Fill #0

## 2020-04-06 DIAGNOSIS — N319 Neuromuscular dysfunction of bladder, unspecified: Secondary | ICD-10-CM | POA: Diagnosis not present

## 2020-04-06 DIAGNOSIS — R339 Retention of urine, unspecified: Secondary | ICD-10-CM | POA: Diagnosis not present

## 2020-04-20 ENCOUNTER — Other Ambulatory Visit (HOSPITAL_COMMUNITY): Payer: Self-pay | Admitting: Anesthesiology

## 2020-04-20 DIAGNOSIS — R69 Illness, unspecified: Secondary | ICD-10-CM | POA: Diagnosis not present

## 2020-04-20 MED FILL — BUPRENORPHINE HCL-NALOXONE: 8-2 | 28 days supply | Qty: 70 | Fill #0

## 2020-05-18 ENCOUNTER — Other Ambulatory Visit (HOSPITAL_COMMUNITY): Payer: Self-pay | Admitting: Anesthesiology

## 2020-05-18 DIAGNOSIS — R69 Illness, unspecified: Secondary | ICD-10-CM | POA: Diagnosis not present

## 2020-05-18 MED FILL — BUPRENORPHINE HCL-NALOXONE: 8-2 | 28 days supply | Qty: 70 | Fill #0

## 2020-05-24 DIAGNOSIS — Z13220 Encounter for screening for lipoid disorders: Secondary | ICD-10-CM | POA: Diagnosis not present

## 2020-05-24 DIAGNOSIS — R630 Anorexia: Secondary | ICD-10-CM | POA: Diagnosis not present

## 2020-05-24 DIAGNOSIS — Z0000 Encounter for general adult medical examination without abnormal findings: Secondary | ICD-10-CM | POA: Diagnosis not present

## 2020-05-24 DIAGNOSIS — Z13228 Encounter for screening for other metabolic disorders: Secondary | ICD-10-CM | POA: Diagnosis not present

## 2020-05-27 ENCOUNTER — Emergency Department (HOSPITAL_COMMUNITY)
Admission: EM | Admit: 2020-05-27 | Discharge: 2020-05-28 | Disposition: A | Discharge status: Home / Self Care | Payer: Medicare HMO | Attending: Emergency Medicine | Admitting: Emergency Medicine

## 2020-05-27 ENCOUNTER — Other Ambulatory Visit: Payer: Self-pay

## 2020-05-27 DIAGNOSIS — R000 Tachycardia, unspecified: Secondary | ICD-10-CM | POA: Insufficient documentation

## 2020-05-27 DIAGNOSIS — R110 Nausea: Secondary | ICD-10-CM | POA: Diagnosis not present

## 2020-05-27 DIAGNOSIS — Z20822 Contact with and (suspected) exposure to covid-19: Secondary | ICD-10-CM | POA: Insufficient documentation

## 2020-05-27 DIAGNOSIS — M791 Myalgia, unspecified site: Secondary | ICD-10-CM

## 2020-05-27 DIAGNOSIS — R531 Weakness: Secondary | ICD-10-CM | POA: Diagnosis not present

## 2020-05-27 DIAGNOSIS — F17210 Nicotine dependence, cigarettes, uncomplicated: Secondary | ICD-10-CM | POA: Insufficient documentation

## 2020-05-27 DIAGNOSIS — R9431 Abnormal electrocardiogram [ECG] [EKG]: Secondary | ICD-10-CM | POA: Diagnosis not present

## 2020-05-27 DIAGNOSIS — R1111 Vomiting without nausea: Secondary | ICD-10-CM | POA: Diagnosis not present

## 2020-05-27 DIAGNOSIS — R42 Dizziness and giddiness: Secondary | ICD-10-CM | POA: Diagnosis not present

## 2020-05-27 DIAGNOSIS — M7918 Myalgia, other site: Secondary | ICD-10-CM | POA: Diagnosis not present

## 2020-05-27 DIAGNOSIS — R112 Nausea with vomiting, unspecified: Secondary | ICD-10-CM | POA: Diagnosis not present

## 2020-05-27 DIAGNOSIS — R69 Illness, unspecified: Secondary | ICD-10-CM | POA: Diagnosis not present

## 2020-05-27 LAB — CBG MONITORING, ED: Glucose-Capillary: 175 mg/dL — ABNORMAL HIGH (ref 70–99)

## 2020-05-27 MED ORDER — LACTATED RINGERS IV BOLUS
1000.0000 mL | Freq: Once | INTRAVENOUS | Status: AC
Start: 2020-05-27 — End: 2020-05-28
  Administered 2020-05-27: 1000 mL via INTRAVENOUS

## 2020-05-27 MED ORDER — ONDANSETRON HCL 4 MG/2ML IJ SOLN
4.0000 mg | Freq: Once | INTRAMUSCULAR | Status: AC
  Administered 2020-05-27: 4 mg via INTRAVENOUS
  Filled 2020-05-27: qty 2

## 2020-05-27 MED ORDER — ACETAMINOPHEN 325 MG PO TABS
650.0000 mg | ORAL_TABLET | Freq: Once | ORAL | Status: AC
  Administered 2020-05-28: 650 mg via ORAL
  Filled 2020-05-27: qty 2

## 2020-05-27 NOTE — ED Triage Notes (Signed)
Pt arrived via [facility] for cc of nausea, vomiting, dizziness, and weakness. Pt reports sudden onset of symptoms at 7pm tonight.
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
## 2020-05-28 LAB — CBC WITH DIFFERENTIAL/PLATELET
Abs Immature Granulocytes: 0.06 10*3/uL (ref 0.00–0.07)
Basophils Absolute: 0 10*3/uL (ref 0.0–0.1)
Basophils Relative: 0 %
Eosinophils Absolute: 0 10*3/uL (ref 0.0–0.5)
Eosinophils Relative: 0 %
HCT: 31.2 % — ABNORMAL LOW (ref 39.0–52.0)
Hemoglobin: 10.6 g/dL — ABNORMAL LOW (ref 13.0–17.0)
Immature Granulocytes: 1 %
Lymphocytes Relative: 5 %
Lymphs Abs: 0.6 10*3/uL — ABNORMAL LOW (ref 0.7–4.0)
MCH: 28 pg (ref 26.0–34.0)
MCHC: 34 g/dL (ref 30.0–36.0)
MCV: 82.5 fL (ref 80.0–100.0)
Monocytes Absolute: 0.1 10*3/uL (ref 0.1–1.0)
Monocytes Relative: 1 %
Neutro Abs: 11.9 10*3/uL — ABNORMAL HIGH (ref 1.7–7.7)
Neutrophils Relative %: 93 %
Platelets: 491 10*3/uL — ABNORMAL HIGH (ref 150–400)
RBC: 3.78 MIL/uL — ABNORMAL LOW (ref 4.22–5.81)
RDW: 16.9 % — ABNORMAL HIGH (ref 11.5–15.5)
WBC: 12.7 10*3/uL — ABNORMAL HIGH (ref 4.0–10.5)
nRBC: 0 % (ref 0.0–0.2)

## 2020-05-28 LAB — URINALYSIS, ROUTINE W REFLEX MICROSCOPIC
Bilirubin Urine: NEGATIVE
Glucose, UA: NEGATIVE mg/dL
Hgb urine dipstick: NEGATIVE
Ketones, ur: 5 mg/dL — AB
Nitrite: NEGATIVE
Protein, ur: NEGATIVE mg/dL
Specific Gravity, Urine: 1.011 (ref 1.005–1.030)
pH: 6 (ref 5.0–8.0)

## 2020-05-28 LAB — COMPREHENSIVE METABOLIC PANEL
ALT: 9 U/L (ref 0–44)
AST: 25 U/L (ref 15–41)
Albumin: 2.4 g/dL — ABNORMAL LOW (ref 3.5–5.0)
Alkaline Phosphatase: 101 U/L (ref 38–126)
Anion gap: 14 (ref 5–15)
BUN: <5 mg/dL — ABNORMAL LOW (ref 6–20)
CO2: 23 mmol/L (ref 22–32)
Calcium: 8.2 mg/dL — ABNORMAL LOW (ref 8.9–10.3)
Chloride: 98 mmol/L (ref 98–111)
Creatinine, Ser: 0.56 mg/dL — ABNORMAL LOW (ref 0.61–1.24)
GFR, Estimated: >60 mL/min (ref >60)
Glucose, Bld: 168 mg/dL — ABNORMAL HIGH (ref 70–99)
Potassium: 2.8 mmol/L — ABNORMAL LOW (ref 3.5–5.1)
Sodium: 135 mmol/L (ref 135–145)
Total Bilirubin: 0.5 mg/dL (ref 0.3–1.2)
Total Protein: 6.2 g/dL — ABNORMAL LOW (ref 6.5–8.1)

## 2020-05-28 LAB — LIPASE, BLOOD: Lipase: 16 U/L (ref 11–51)

## 2020-05-28 LAB — SARS CORONAVIRUS 2 (TAT 6-24 HRS): SARS Coronavirus 2: NEGATIVE

## 2020-05-28 MED ORDER — ONDANSETRON 4 MG PO TBDP: 4.0000 mg | ORAL_TABLET | Freq: Three times a day (TID) | ORAL | 0 refills | Status: AC | PRN

## 2020-05-28 MED ORDER — CEPHALEXIN 500 MG PO CAPS: 500.0000 mg | ORAL_CAPSULE | Freq: Three times a day (TID) | ORAL | 0 refills | Status: AC

## 2020-05-28 MED ORDER — SODIUM CHLORIDE 0.9 % IV SOLN
1.0000 g | Freq: Once | INTRAVENOUS | Status: AC
  Administered 2020-05-28: 1 g via INTRAVENOUS
  Filled 2020-05-28: qty 10

## 2020-05-28 NOTE — ED Provider Notes (Signed)
Assumed care from Dr. Mellody Dance at shift change.  See prior notes for full H&P.  Briefly, 45 year old male here with 1 day of myalgias, generalized weakness, nausea, vomiting, and chills.  Does have history of L1 spinal injury with subsequent urinary retention and does self cath.  He was given IV fluids, Zofran, and Tylenol.  Plan:  Labs pending.  Will follow and reassess.  Results for orders placed or performed during the hospital encounter of 05/27/20  CBC with Differential  Result Value Ref Range   WBC 12.7 (H) 4.0 - 10.5 K/uL   RBC 3.78 (L) 4.22 - 5.81 MIL/uL   Hemoglobin 10.6 (L) 13.0 - 17.0 g/dL   HCT 44.0 (L) 34.7 - 42.5 %   MCV 82.5 80.0 - 100.0 fL   MCH 28.0 26.0 - 34.0 pg   MCHC 34.0 30.0 - 36.0 g/dL   RDW 95.6 (H) 38.7 - 56.4 %   Platelets 491 (H) 150 - 400 K/uL   nRBC 0.0 0.0 - 0.2 %   Neutrophils Relative % 93 %   Neutro Abs 11.9 (H) 1.7 - 7.7 K/uL   Lymphocytes Relative 5 %   Lymphs Abs 0.6 (L) 0.7 - 4.0 K/uL   Monocytes Relative 1 %   Monocytes Absolute 0.1 0.1 - 1.0 K/uL   Eosinophils Relative 0 %   Eosinophils Absolute 0.0 0.0 - 0.5 K/uL   Basophils Relative 0 %   Basophils Absolute 0.0 0.0 - 0.1 K/uL   Immature Granulocytes 1 %   Abs Immature Granulocytes 0.06 0.00 - 0.07 K/uL  Comprehensive metabolic panel  Result Value Ref Range   Sodium 135 135 - 145 mmol/L   Potassium 2.8 (L) 3.5 - 5.1 mmol/L   Chloride 98 98 - 111 mmol/L   CO2 23 22 - 32 mmol/L   Glucose, Bld 168 (H) 70 - 99 mg/dL   BUN <5 (L) 6 - 20 mg/dL   Creatinine, Ser 3.32 (L) 0.61 - 1.24 mg/dL   Calcium 8.2 (L) 8.9 - 10.3 mg/dL   Total Protein 6.2 (L) 6.5 - 8.1 g/dL   Albumin 2.4 (L) 3.5 - 5.0 g/dL   AST 25 15 - 41 U/L   ALT 9 0 - 44 U/L   Alkaline Phosphatase 101 38 - 126 U/L   Total Bilirubin 0.5 0.3 - 1.2 mg/dL   GFR, Estimated >95 >18 mL/min   Anion gap 14 5 - 15  Lipase, blood  Result Value Ref Range   Lipase 16 11 - 51 U/L  Urinalysis, Routine w reflex microscopic Urine, Catheterized   Result Value Ref Range   Color, Urine YELLOW YELLOW   APPearance CLEAR CLEAR   Specific Gravity, Urine 1.011 1.005 - 1.030   pH 6.0 5.0 - 8.0   Glucose, UA NEGATIVE NEGATIVE mg/dL   Hgb urine dipstick NEGATIVE NEGATIVE   Bilirubin Urine NEGATIVE NEGATIVE   Ketones, ur 5 (A) NEGATIVE mg/dL   Protein, ur NEGATIVE NEGATIVE mg/dL   Nitrite NEGATIVE NEGATIVE   Leukocytes,Ua SMALL (A) NEGATIVE   RBC / HPF 0-5 0 - 5 RBC/hpf   WBC, UA 21-50 0 - 5 WBC/hpf   Bacteria, UA RARE (A) NONE SEEN   Squamous Epithelial / LPF 0-5 0 - 5   Mucus PRESENT    Hyaline Casts, UA PRESENT   CBG monitoring, ED  Result Value Ref Range   Glucose-Capillary 175 (H) 70 - 99 mg/dL   No results found. Labs as above.  UA with bacteria and 21-50  WBC.  Given he self caths, will err on side of caution and given dose of IV Rocephin.  2:55 AM VS remain stable after IVF and abx.  States he feels ok, still not 100% but better than when he arrived.  covid screen still pending.  Given his vitals are stable, he is tolerating PO, feel he is stable for discharge home.  Will send with few days of keflex to ensure urine infection clears. Rx zofran as well, encouraged to push PO intake.  He will be notified if covid screen is positive.   Close follow-up with PCP encouraged.  Return here for any new/acute changes.   Garlon Hatchet, PA-C 05/28/20 [id number]    Geoffery Lyons, MD 05/28/20 [phone]

## 2020-05-28 NOTE — Discharge Instructions (Signed)
Nausea medication and antibiotics sent to your pharmacy for you.  Take as directed.  Try to make sure to push oral fluids and stay hydrated. You will be notified if your Covid test is positive. Follow-up with your primary care doctor. Return here for any new or acute changes.

## 2020-05-29 LAB — URINE CULTURE

## 2020-06-15 ENCOUNTER — Other Ambulatory Visit (HOSPITAL_COMMUNITY): Payer: Self-pay | Admitting: Anesthesiology

## 2020-06-15 DIAGNOSIS — R69 Illness, unspecified: Secondary | ICD-10-CM | POA: Diagnosis not present

## 2020-06-15 MED FILL — BUPRENORPHINE HCL-NALOXONE: 8-2 | 28 days supply | Qty: 70 | Fill #0

## 2020-06-29 DIAGNOSIS — N319 Neuromuscular dysfunction of bladder, unspecified: Secondary | ICD-10-CM | POA: Diagnosis not present

## 2020-06-29 DIAGNOSIS — R339 Retention of urine, unspecified: Secondary | ICD-10-CM | POA: Diagnosis not present

## 2020-07-07 ENCOUNTER — Other Ambulatory Visit (HOSPITAL_BASED_OUTPATIENT_CLINIC_OR_DEPARTMENT_OTHER): Payer: Self-pay

## 2020-07-13 ENCOUNTER — Other Ambulatory Visit (HOSPITAL_COMMUNITY): Payer: Self-pay | Admitting: Anesthesiology

## 2020-07-13 DIAGNOSIS — R69 Illness, unspecified: Secondary | ICD-10-CM | POA: Diagnosis not present

## 2020-07-13 MED FILL — BUPREN-NALOX FILM 8/2MG: 8-2 | 28 days supply | Qty: 70 | Fill #0

## 2020-08-10 ENCOUNTER — Other Ambulatory Visit (HOSPITAL_COMMUNITY): Payer: Self-pay

## 2020-08-10 DIAGNOSIS — R69 Illness, unspecified: Secondary | ICD-10-CM | POA: Diagnosis not present

## 2020-08-10 MED ORDER — BUPRENORPHINE HCL-NALOXONE HCL 8-2 MG SL FILM
ORAL_FILM | SUBLINGUAL | 0 refills | Status: DC
Start: 2020-08-10 — End: 2020-09-07
  Filled 2020-08-10: qty 70, 28d supply, fill #0

## 2020-09-07 ENCOUNTER — Other Ambulatory Visit (HOSPITAL_COMMUNITY): Payer: Self-pay

## 2020-09-07 DIAGNOSIS — R69 Illness, unspecified: Secondary | ICD-10-CM | POA: Diagnosis not present

## 2020-09-07 MED ORDER — BUPRENORPHINE HCL-NALOXONE HCL 8-2 MG SL FILM
ORAL_FILM | SUBLINGUAL | 0 refills | Status: DC
  Filled 2020-09-07: qty 70, 28d supply, fill #0

## 2020-09-28 DIAGNOSIS — R339 Retention of urine, unspecified: Secondary | ICD-10-CM | POA: Diagnosis not present

## 2020-09-28 DIAGNOSIS — N319 Neuromuscular dysfunction of bladder, unspecified: Secondary | ICD-10-CM | POA: Diagnosis not present

## 2020-10-05 ENCOUNTER — Other Ambulatory Visit (HOSPITAL_COMMUNITY): Payer: Self-pay

## 2020-10-05 DIAGNOSIS — R69 Illness, unspecified: Secondary | ICD-10-CM | POA: Diagnosis not present

## 2020-10-05 MED ORDER — BUPRENORPHINE HCL-NALOXONE HCL 8-2 MG SL FILM
ORAL_FILM | SUBLINGUAL | 0 refills | Status: DC
  Filled 2020-10-05: qty 70, 28d supply, fill #0

## 2020-11-02 ENCOUNTER — Other Ambulatory Visit (HOSPITAL_COMMUNITY): Payer: Self-pay

## 2020-11-02 DIAGNOSIS — R69 Illness, unspecified: Secondary | ICD-10-CM | POA: Diagnosis not present

## 2020-11-02 MED ORDER — BUPRENORPHINE HCL-NALOXONE HCL 8-2 MG SL FILM
ORAL_FILM | SUBLINGUAL | 0 refills | Status: DC
  Filled 2020-11-02: qty 88, 35d supply, fill #0

## 2020-11-03 ENCOUNTER — Other Ambulatory Visit (HOSPITAL_COMMUNITY): Payer: Self-pay

## 2020-11-22 DIAGNOSIS — R69 Illness, unspecified: Secondary | ICD-10-CM | POA: Diagnosis not present

## 2020-11-22 DIAGNOSIS — N319 Neuromuscular dysfunction of bladder, unspecified: Secondary | ICD-10-CM | POA: Diagnosis not present

## 2020-11-22 DIAGNOSIS — R339 Retention of urine, unspecified: Secondary | ICD-10-CM | POA: Diagnosis not present

## 2020-11-22 DIAGNOSIS — G40909 Epilepsy, unspecified, not intractable, without status epilepticus: Secondary | ICD-10-CM | POA: Diagnosis not present

## 2020-12-07 ENCOUNTER — Other Ambulatory Visit (HOSPITAL_COMMUNITY): Payer: Self-pay

## 2020-12-07 DIAGNOSIS — R69 Illness, unspecified: Secondary | ICD-10-CM | POA: Diagnosis not present

## 2020-12-07 MED ORDER — BUPRENORPHINE HCL-NALOXONE HCL 8-2 MG SL FILM
ORAL_FILM | SUBLINGUAL | 0 refills | Status: DC
  Filled 2020-12-07: qty 75, 30d supply, fill #0

## 2020-12-21 DIAGNOSIS — N319 Neuromuscular dysfunction of bladder, unspecified: Secondary | ICD-10-CM | POA: Diagnosis not present

## 2020-12-21 DIAGNOSIS — R339 Retention of urine, unspecified: Secondary | ICD-10-CM | POA: Diagnosis not present

## 2021-01-04 ENCOUNTER — Other Ambulatory Visit (HOSPITAL_COMMUNITY): Payer: Self-pay

## 2021-01-04 DIAGNOSIS — F1120 Opioid dependence, uncomplicated: Secondary | ICD-10-CM | POA: Diagnosis not present

## 2021-01-04 DIAGNOSIS — R69 Illness, unspecified: Secondary | ICD-10-CM | POA: Diagnosis not present

## 2021-01-04 MED ORDER — BUPRENORPHINE HCL-NALOXONE HCL 8-2 MG SL FILM
ORAL_FILM | SUBLINGUAL | 0 refills | Status: DC
  Filled 2021-01-04: qty 75, 30d supply, fill #0

## 2021-02-01 ENCOUNTER — Other Ambulatory Visit (HOSPITAL_COMMUNITY): Payer: Self-pay

## 2021-02-01 DIAGNOSIS — R69 Illness, unspecified: Secondary | ICD-10-CM | POA: Diagnosis not present

## 2021-02-01 MED ORDER — BUPRENORPHINE HCL-NALOXONE HCL 8-2 MG SL FILM
ORAL_FILM | SUBLINGUAL | 0 refills | Status: AC
  Filled 2021-02-01: qty 75, 30d supply, fill #0

## 2021-03-02 ENCOUNTER — Other Ambulatory Visit (HOSPITAL_COMMUNITY): Payer: Self-pay

## 2021-03-02 DIAGNOSIS — F1120 Opioid dependence, uncomplicated: Secondary | ICD-10-CM | POA: Diagnosis not present

## 2021-03-02 DIAGNOSIS — R69 Illness, unspecified: Secondary | ICD-10-CM | POA: Diagnosis not present

## 2021-03-02 MED ORDER — BUPRENORPHINE HCL-NALOXONE HCL 8-2 MG SL FILM
ORAL_FILM | SUBLINGUAL | 0 refills | Status: AC
  Filled 2021-03-02: qty 75, 30d supply, fill #0

## 2021-03-03 ENCOUNTER — Other Ambulatory Visit (HOSPITAL_COMMUNITY): Payer: Self-pay

## 2021-03-04 ENCOUNTER — Other Ambulatory Visit (HOSPITAL_COMMUNITY): Payer: Self-pay

## 2021-03-05 ENCOUNTER — Other Ambulatory Visit (HOSPITAL_COMMUNITY): Payer: Self-pay

## 2021-03-15 DIAGNOSIS — N319 Neuromuscular dysfunction of bladder, unspecified: Secondary | ICD-10-CM | POA: Diagnosis not present

## 2021-03-15 DIAGNOSIS — R339 Retention of urine, unspecified: Secondary | ICD-10-CM | POA: Diagnosis not present

## 2021-03-30 ENCOUNTER — Other Ambulatory Visit (HOSPITAL_COMMUNITY): Payer: Self-pay

## 2021-03-30 DIAGNOSIS — F1120 Opioid dependence, uncomplicated: Secondary | ICD-10-CM | POA: Diagnosis not present

## 2021-03-30 DIAGNOSIS — R69 Illness, unspecified: Secondary | ICD-10-CM | POA: Diagnosis not present

## 2021-03-30 MED ORDER — BUPRENORPHINE HCL-NALOXONE HCL 8-2 MG SL FILM
ORAL_FILM | SUBLINGUAL | 1 refills | Status: DC
  Filled 2021-03-30: qty 75, 30d supply, fill #0
  Filled 2021-04-27: qty 75, 30d supply, fill #1

## 2021-03-31 ENCOUNTER — Other Ambulatory Visit (HOSPITAL_COMMUNITY): Payer: Self-pay

## 2021-04-27 ENCOUNTER — Other Ambulatory Visit (HOSPITAL_COMMUNITY): Payer: Self-pay

## 2021-04-28 ENCOUNTER — Other Ambulatory Visit (HOSPITAL_COMMUNITY): Payer: Self-pay

## 2021-05-09 DIAGNOSIS — N319 Neuromuscular dysfunction of bladder, unspecified: Secondary | ICD-10-CM | POA: Diagnosis not present

## 2021-05-09 DIAGNOSIS — R69 Illness, unspecified: Secondary | ICD-10-CM | POA: Diagnosis not present

## 2021-05-09 DIAGNOSIS — G40909 Epilepsy, unspecified, not intractable, without status epilepticus: Secondary | ICD-10-CM | POA: Diagnosis not present

## 2021-05-26 DIAGNOSIS — F1120 Opioid dependence, uncomplicated: Secondary | ICD-10-CM | POA: Diagnosis not present

## 2021-05-26 DIAGNOSIS — R69 Illness, unspecified: Secondary | ICD-10-CM | POA: Diagnosis not present

## 2021-06-01 ENCOUNTER — Other Ambulatory Visit (HOSPITAL_COMMUNITY): Payer: Self-pay

## 2021-06-01 DIAGNOSIS — R69 Illness, unspecified: Secondary | ICD-10-CM | POA: Diagnosis not present

## 2021-06-01 DIAGNOSIS — F32A Depression, unspecified: Secondary | ICD-10-CM | POA: Diagnosis not present

## 2021-06-01 DIAGNOSIS — G40909 Epilepsy, unspecified, not intractable, without status epilepticus: Secondary | ICD-10-CM | POA: Diagnosis not present

## 2021-06-01 DIAGNOSIS — Z79899 Other long term (current) drug therapy: Secondary | ICD-10-CM | POA: Diagnosis not present

## 2021-06-01 DIAGNOSIS — F1120 Opioid dependence, uncomplicated: Secondary | ICD-10-CM | POA: Diagnosis not present

## 2021-06-01 MED ORDER — BUPRENORPHINE HCL-NALOXONE HCL 8-2 MG SL FILM
ORAL_FILM | SUBLINGUAL | 0 refills | Status: DC
  Filled 2021-06-01: qty 75, 30d supply, fill #0

## 2021-06-01 MED ORDER — NALOXONE HCL 4 MG/0.1ML NA LIQD
NASAL | 0 refills | Status: AC
  Filled 2021-06-01 (×2): qty 2, 1d supply, fill #0

## 2021-06-07 DIAGNOSIS — R339 Retention of urine, unspecified: Secondary | ICD-10-CM | POA: Diagnosis not present

## 2021-06-07 DIAGNOSIS — N319 Neuromuscular dysfunction of bladder, unspecified: Secondary | ICD-10-CM | POA: Diagnosis not present

## 2021-06-29 ENCOUNTER — Other Ambulatory Visit (HOSPITAL_COMMUNITY): Payer: Self-pay

## 2021-06-29 DIAGNOSIS — G40909 Epilepsy, unspecified, not intractable, without status epilepticus: Secondary | ICD-10-CM | POA: Diagnosis not present

## 2021-06-29 DIAGNOSIS — F32A Depression, unspecified: Secondary | ICD-10-CM | POA: Diagnosis not present

## 2021-06-29 DIAGNOSIS — F17210 Nicotine dependence, cigarettes, uncomplicated: Secondary | ICD-10-CM | POA: Diagnosis not present

## 2021-06-29 DIAGNOSIS — F17200 Nicotine dependence, unspecified, uncomplicated: Secondary | ICD-10-CM | POA: Diagnosis not present

## 2021-06-29 DIAGNOSIS — R69 Illness, unspecified: Secondary | ICD-10-CM | POA: Diagnosis not present

## 2021-06-29 DIAGNOSIS — F1120 Opioid dependence, uncomplicated: Secondary | ICD-10-CM | POA: Diagnosis not present

## 2021-06-29 DIAGNOSIS — Z79899 Other long term (current) drug therapy: Secondary | ICD-10-CM | POA: Diagnosis not present

## 2021-06-29 MED ORDER — BUPRENORPHINE HCL-NALOXONE HCL 8-2 MG SL FILM
ORAL_FILM | SUBLINGUAL | 0 refills | Status: DC
  Filled 2021-06-29: qty 75, 30d supply, fill #0

## 2021-07-28 ENCOUNTER — Other Ambulatory Visit (HOSPITAL_COMMUNITY): Payer: Self-pay

## 2021-07-28 DIAGNOSIS — G4700 Insomnia, unspecified: Secondary | ICD-10-CM | POA: Diagnosis not present

## 2021-07-28 DIAGNOSIS — R69 Illness, unspecified: Secondary | ICD-10-CM | POA: Diagnosis not present

## 2021-07-28 DIAGNOSIS — F32A Depression, unspecified: Secondary | ICD-10-CM | POA: Diagnosis not present

## 2021-07-28 DIAGNOSIS — F1120 Opioid dependence, uncomplicated: Secondary | ICD-10-CM | POA: Diagnosis not present

## 2021-07-28 DIAGNOSIS — G40909 Epilepsy, unspecified, not intractable, without status epilepticus: Secondary | ICD-10-CM | POA: Diagnosis not present

## 2021-07-28 DIAGNOSIS — Z79899 Other long term (current) drug therapy: Secondary | ICD-10-CM | POA: Diagnosis not present

## 2021-07-28 MED ORDER — BUPRENORPHINE HCL-NALOXONE HCL 8-2 MG SL FILM
ORAL_FILM | SUBLINGUAL | 0 refills | Status: DC
  Filled 2021-07-28: qty 75, 30d supply, fill #0

## 2021-07-28 MED ORDER — TRAZODONE HCL 50 MG PO TABS
ORAL_TABLET | ORAL | 2 refills | Status: AC
  Filled 2021-07-28: qty 15, 30d supply, fill #0

## 2021-08-25 ENCOUNTER — Other Ambulatory Visit (HOSPITAL_COMMUNITY): Payer: Self-pay

## 2021-08-25 DIAGNOSIS — G40909 Epilepsy, unspecified, not intractable, without status epilepticus: Secondary | ICD-10-CM | POA: Diagnosis not present

## 2021-08-25 DIAGNOSIS — F1120 Opioid dependence, uncomplicated: Secondary | ICD-10-CM | POA: Diagnosis not present

## 2021-08-25 DIAGNOSIS — Z79899 Other long term (current) drug therapy: Secondary | ICD-10-CM | POA: Diagnosis not present

## 2021-08-25 DIAGNOSIS — G4700 Insomnia, unspecified: Secondary | ICD-10-CM | POA: Diagnosis not present

## 2021-08-25 DIAGNOSIS — F32A Depression, unspecified: Secondary | ICD-10-CM | POA: Diagnosis not present

## 2021-08-25 DIAGNOSIS — R69 Illness, unspecified: Secondary | ICD-10-CM | POA: Diagnosis not present

## 2021-08-25 MED ORDER — BUPRENORPHINE HCL-NALOXONE HCL 8-2 MG SL FILM
ORAL_FILM | SUBLINGUAL | 0 refills | Status: DC
  Filled 2021-08-25: qty 75, 30d supply, fill #0

## 2021-08-31 DIAGNOSIS — R339 Retention of urine, unspecified: Secondary | ICD-10-CM | POA: Diagnosis not present

## 2021-08-31 DIAGNOSIS — N319 Neuromuscular dysfunction of bladder, unspecified: Secondary | ICD-10-CM | POA: Diagnosis not present

## 2021-09-27 ENCOUNTER — Other Ambulatory Visit (HOSPITAL_COMMUNITY): Payer: Self-pay

## 2021-09-27 DIAGNOSIS — G40909 Epilepsy, unspecified, not intractable, without status epilepticus: Secondary | ICD-10-CM | POA: Diagnosis not present

## 2021-09-27 DIAGNOSIS — Z79899 Other long term (current) drug therapy: Secondary | ICD-10-CM | POA: Diagnosis not present

## 2021-09-27 DIAGNOSIS — G4700 Insomnia, unspecified: Secondary | ICD-10-CM | POA: Diagnosis not present

## 2021-09-27 DIAGNOSIS — F1120 Opioid dependence, uncomplicated: Secondary | ICD-10-CM | POA: Diagnosis not present

## 2021-09-27 DIAGNOSIS — F32A Depression, unspecified: Secondary | ICD-10-CM | POA: Diagnosis not present

## 2021-09-27 DIAGNOSIS — R69 Illness, unspecified: Secondary | ICD-10-CM | POA: Diagnosis not present

## 2021-09-27 MED ORDER — BUPRENORPHINE HCL-NALOXONE HCL 8-2 MG SL FILM
ORAL_FILM | SUBLINGUAL | 0 refills | Status: DC
  Filled 2021-09-27: qty 75, 30d supply, fill #0

## 2021-09-29 DIAGNOSIS — R69 Illness, unspecified: Secondary | ICD-10-CM | POA: Diagnosis not present

## 2021-09-29 DIAGNOSIS — M5459 Other low back pain: Secondary | ICD-10-CM | POA: Diagnosis not present

## 2021-09-29 DIAGNOSIS — R339 Retention of urine, unspecified: Secondary | ICD-10-CM | POA: Diagnosis not present

## 2021-09-29 DIAGNOSIS — G40909 Epilepsy, unspecified, not intractable, without status epilepticus: Secondary | ICD-10-CM | POA: Diagnosis not present

## 2021-09-29 DIAGNOSIS — N319 Neuromuscular dysfunction of bladder, unspecified: Secondary | ICD-10-CM | POA: Diagnosis not present

## 2021-10-27 ENCOUNTER — Other Ambulatory Visit (HOSPITAL_COMMUNITY): Payer: Self-pay

## 2021-10-27 DIAGNOSIS — R69 Illness, unspecified: Secondary | ICD-10-CM | POA: Diagnosis not present

## 2021-10-27 DIAGNOSIS — F1120 Opioid dependence, uncomplicated: Secondary | ICD-10-CM | POA: Diagnosis not present

## 2021-10-27 DIAGNOSIS — G40909 Epilepsy, unspecified, not intractable, without status epilepticus: Secondary | ICD-10-CM | POA: Diagnosis not present

## 2021-10-27 DIAGNOSIS — Z6820 Body mass index (BMI) 20.0-20.9, adult: Secondary | ICD-10-CM | POA: Diagnosis not present

## 2021-10-27 DIAGNOSIS — Z79899 Other long term (current) drug therapy: Secondary | ICD-10-CM | POA: Diagnosis not present

## 2021-10-27 DIAGNOSIS — G4700 Insomnia, unspecified: Secondary | ICD-10-CM | POA: Diagnosis not present

## 2021-10-27 MED ORDER — BUPRENORPHINE HCL-NALOXONE HCL 8-2 MG SL FILM
ORAL_FILM | SUBLINGUAL | 0 refills | Status: DC
  Filled 2021-10-27: qty 75, 30d supply, fill #0

## 2021-11-23 DIAGNOSIS — N319 Neuromuscular dysfunction of bladder, unspecified: Secondary | ICD-10-CM | POA: Diagnosis not present

## 2021-11-23 DIAGNOSIS — R339 Retention of urine, unspecified: Secondary | ICD-10-CM | POA: Diagnosis not present

## 2021-11-24 ENCOUNTER — Other Ambulatory Visit (HOSPITAL_COMMUNITY): Payer: Self-pay

## 2021-11-24 DIAGNOSIS — Z79899 Other long term (current) drug therapy: Secondary | ICD-10-CM | POA: Diagnosis not present

## 2021-11-24 DIAGNOSIS — G4700 Insomnia, unspecified: Secondary | ICD-10-CM | POA: Diagnosis not present

## 2021-11-24 DIAGNOSIS — G40909 Epilepsy, unspecified, not intractable, without status epilepticus: Secondary | ICD-10-CM | POA: Diagnosis not present

## 2021-11-24 DIAGNOSIS — R69 Illness, unspecified: Secondary | ICD-10-CM | POA: Diagnosis not present

## 2021-11-24 MED ORDER — BUPRENORPHINE HCL-NALOXONE HCL 8-2 MG SL FILM
ORAL_FILM | SUBLINGUAL | 0 refills | Status: DC
  Filled 2021-11-24: qty 75, 30d supply, fill #0

## 2021-12-22 ENCOUNTER — Other Ambulatory Visit (HOSPITAL_COMMUNITY): Payer: Self-pay

## 2021-12-22 DIAGNOSIS — G40909 Epilepsy, unspecified, not intractable, without status epilepticus: Secondary | ICD-10-CM | POA: Diagnosis not present

## 2021-12-22 DIAGNOSIS — R69 Illness, unspecified: Secondary | ICD-10-CM | POA: Diagnosis not present

## 2021-12-22 DIAGNOSIS — F1120 Opioid dependence, uncomplicated: Secondary | ICD-10-CM | POA: Diagnosis not present

## 2021-12-22 DIAGNOSIS — G4700 Insomnia, unspecified: Secondary | ICD-10-CM | POA: Diagnosis not present

## 2021-12-22 DIAGNOSIS — Z79899 Other long term (current) drug therapy: Secondary | ICD-10-CM | POA: Diagnosis not present

## 2021-12-22 MED ORDER — BUPRENORPHINE HCL-NALOXONE HCL 8-2 MG SL FILM
ORAL_FILM | SUBLINGUAL | 0 refills | Status: DC
  Filled 2021-12-22: qty 75, 30d supply, fill #0

## 2021-12-23 ENCOUNTER — Other Ambulatory Visit (HOSPITAL_COMMUNITY): Payer: Self-pay

## 2021-12-24 ENCOUNTER — Other Ambulatory Visit (HOSPITAL_COMMUNITY): Payer: Self-pay

## 2021-12-26 ENCOUNTER — Other Ambulatory Visit (HOSPITAL_COMMUNITY): Payer: Self-pay

## 2022-01-20 ENCOUNTER — Other Ambulatory Visit (HOSPITAL_COMMUNITY): Payer: Self-pay

## 2022-01-20 DIAGNOSIS — R69 Illness, unspecified: Secondary | ICD-10-CM | POA: Diagnosis not present

## 2022-01-20 DIAGNOSIS — G40909 Epilepsy, unspecified, not intractable, without status epilepticus: Secondary | ICD-10-CM | POA: Diagnosis not present

## 2022-01-20 DIAGNOSIS — G4700 Insomnia, unspecified: Secondary | ICD-10-CM | POA: Diagnosis not present

## 2022-01-20 DIAGNOSIS — Z79899 Other long term (current) drug therapy: Secondary | ICD-10-CM | POA: Diagnosis not present

## 2022-01-20 DIAGNOSIS — F1120 Opioid dependence, uncomplicated: Secondary | ICD-10-CM | POA: Diagnosis not present

## 2022-01-20 MED ORDER — BUPRENORPHINE HCL-NALOXONE HCL 8-2 MG SL FILM
2.5000 | ORAL_FILM | Freq: Every day | SUBLINGUAL | 0 refills | Status: AC
  Filled 2022-01-20: qty 75, 30d supply, fill #0

## 2022-01-23 ENCOUNTER — Other Ambulatory Visit (HOSPITAL_COMMUNITY): Payer: Self-pay

## 2022-01-24 ENCOUNTER — Other Ambulatory Visit (HOSPITAL_BASED_OUTPATIENT_CLINIC_OR_DEPARTMENT_OTHER): Payer: Self-pay

## 2022-02-01 DIAGNOSIS — M5459 Other low back pain: Secondary | ICD-10-CM | POA: Diagnosis not present

## 2022-02-01 DIAGNOSIS — G40909 Epilepsy, unspecified, not intractable, without status epilepticus: Secondary | ICD-10-CM | POA: Diagnosis not present

## 2022-02-01 DIAGNOSIS — N319 Neuromuscular dysfunction of bladder, unspecified: Secondary | ICD-10-CM | POA: Diagnosis not present

## 2022-02-01 DIAGNOSIS — R69 Illness, unspecified: Secondary | ICD-10-CM | POA: Diagnosis not present

## 2022-02-01 DIAGNOSIS — R339 Retention of urine, unspecified: Secondary | ICD-10-CM | POA: Diagnosis not present

## 2022-02-11 DIAGNOSIS — Z743 Need for continuous supervision: Secondary | ICD-10-CM | POA: Diagnosis not present

## 2022-02-11 DIAGNOSIS — I499 Cardiac arrhythmia, unspecified: Secondary | ICD-10-CM | POA: Diagnosis not present

## 2022-02-11 DIAGNOSIS — R404 Transient alteration of awareness: Secondary | ICD-10-CM | POA: Diagnosis not present

## 2022-02-11 DIAGNOSIS — I469 Cardiac arrest, cause unspecified: Secondary | ICD-10-CM | POA: Diagnosis not present

## 2022-02-15 DEATH — deceased
